# Patient Record
Sex: Male | Born: 1960 | Race: Black or African American | Hispanic: No | Marital: Single | Smoking: Former smoker
Health system: Southern US, Community
[De-identification: ages and names within clinical notes are randomized; demographics above are authoritative.]

## PROBLEM LIST (undated history)

## (undated) DIAGNOSIS — E119 Type 2 diabetes mellitus without complications: Secondary | ICD-10-CM

## (undated) DIAGNOSIS — K859 Acute pancreatitis without necrosis or infection, unspecified: Secondary | ICD-10-CM

## (undated) DIAGNOSIS — I10 Essential (primary) hypertension: Secondary | ICD-10-CM

## (undated) DIAGNOSIS — W3400XA Accidental discharge from unspecified firearms or gun, initial encounter: Secondary | ICD-10-CM

## (undated) DIAGNOSIS — S21339A Puncture wound without foreign body of unspecified front wall of thorax with penetration into thoracic cavity, initial encounter: Secondary | ICD-10-CM

## (undated) DIAGNOSIS — J45909 Unspecified asthma, uncomplicated: Secondary | ICD-10-CM

## (undated) HISTORY — PX: COLOSTOMY: SHX63

## (undated) HISTORY — PX: ABDOMINAL SURGERY: SHX537

---

## 2006-05-13 ENCOUNTER — Other Ambulatory Visit: Payer: Self-pay

## 2006-05-13 ENCOUNTER — Emergency Department: Payer: Self-pay | Admitting: Emergency Medicine

## 2006-05-17 ENCOUNTER — Emergency Department: Payer: Self-pay

## 2007-04-01 ENCOUNTER — Emergency Department: Payer: Self-pay | Admitting: Emergency Medicine

## 2007-04-04 ENCOUNTER — Emergency Department: Payer: Self-pay | Admitting: Unknown Physician Specialty

## 2007-04-04 ENCOUNTER — Other Ambulatory Visit: Payer: Self-pay

## 2008-11-22 ENCOUNTER — Emergency Department: Payer: Self-pay

## 2009-01-22 ENCOUNTER — Emergency Department: Payer: Self-pay | Admitting: Emergency Medicine

## 2012-05-07 ENCOUNTER — Emergency Department: Payer: Self-pay | Admitting: Emergency Medicine

## 2012-05-07 LAB — COMPREHENSIVE METABOLIC PANEL
Albumin: 4.3 g/dL (ref 3.4–5.0)
Alkaline Phosphatase: 96 U/L (ref 50–136)
Bilirubin,Total: 0.7 mg/dL (ref 0.2–1.0)
Co2: 29 mmol/L (ref 21–32)
Creatinine: 1.24 mg/dL (ref 0.60–1.30)
EGFR (Non-African Amer.): >60
Glucose: 100 mg/dL — ABNORMAL HIGH (ref 65–99)
Osmolality: 281 (ref 275–301)
Sodium: 141 mmol/L (ref 136–145)

## 2012-05-07 LAB — DIFFERENTIAL
Basophil #: 0 10*3/uL (ref 0.0–0.1)
Lymphocyte #: 1.7 10*3/uL (ref 1.0–3.6)
Monocyte #: 0.6 x10 3/mm (ref 0.2–1.0)
Monocyte %: 12.8 %
Neutrophil #: 2 10*3/uL (ref 1.4–6.5)
Neutrophil %: 45.3 %

## 2012-05-07 LAB — URINALYSIS, COMPLETE
Bacteria: NONE SEEN
Bilirubin,UR: NEGATIVE
Glucose,UR: NEGATIVE mg/dL (ref 0–75)
Leukocyte Esterase: NEGATIVE
RBC,UR: 3 /HPF (ref 0–5)
Squamous Epithelial: NONE SEEN
WBC UR: NONE SEEN /HPF (ref 0–5)

## 2012-05-07 LAB — CBC
HCT: 45.3 % (ref 40.0–52.0)
MCHC: 32.5 g/dL (ref 32.0–36.0)
MCV: 94 fL (ref 80–100)
Platelet: 168 10*3/uL (ref 150–440)
RDW: 13.4 % (ref 11.5–14.5)
WBC: 4.4 10*3/uL (ref 3.8–10.6)

## 2012-05-07 LAB — LIPASE, BLOOD: Lipase: 112 U/L (ref 73–393)

## 2012-05-10 ENCOUNTER — Emergency Department: Payer: Self-pay | Admitting: Emergency Medicine

## 2012-05-10 LAB — COMPREHENSIVE METABOLIC PANEL
Albumin: 4.4 g/dL (ref 3.4–5.0)
Alkaline Phosphatase: 101 U/L (ref 50–136)
BUN: 13 mg/dL (ref 7–18)
Bilirubin,Total: 0.6 mg/dL (ref 0.2–1.0)
Co2: 30 mmol/L (ref 21–32)
Creatinine: 1.29 mg/dL (ref 0.60–1.30)
EGFR (Non-African Amer.): >60
Osmolality: 274 (ref 275–301)
SGPT (ALT): 102 U/L — ABNORMAL HIGH
Sodium: 137 mmol/L (ref 136–145)
Total Protein: 8.8 g/dL — ABNORMAL HIGH (ref 6.4–8.2)

## 2012-05-10 LAB — CBC
HCT: 47.3 % (ref 40.0–52.0)
HGB: 14.9 g/dL (ref 13.0–18.0)
MCHC: 31.4 g/dL — ABNORMAL LOW (ref 32.0–36.0)
Platelet: 171 10*3/uL (ref 150–440)
RBC: 5.06 10*6/uL (ref 4.40–5.90)

## 2012-05-10 LAB — PROTIME-INR: Prothrombin Time: 12.5 secs (ref 11.5–14.7)

## 2012-05-10 LAB — LIPASE, BLOOD: Lipase: 90 U/L (ref 73–393)

## 2012-12-25 ENCOUNTER — Emergency Department: Payer: Self-pay | Admitting: Emergency Medicine

## 2012-12-25 LAB — URINALYSIS, COMPLETE
Bilirubin,UR: NEGATIVE
Ketone: NEGATIVE
Leukocyte Esterase: NEGATIVE
Nitrite: NEGATIVE
Ph: 7 (ref 4.5–8.0)
Protein: NEGATIVE
Specific Gravity: 1.02 (ref 1.003–1.030)
WBC UR: 1 /HPF (ref 0–5)

## 2012-12-25 LAB — COMPREHENSIVE METABOLIC PANEL
BUN: 8 mg/dL (ref 7–18)
Bilirubin,Total: 0.6 mg/dL (ref 0.2–1.0)
Calcium, Total: 9.5 mg/dL (ref 8.5–10.1)
Chloride: 103 mmol/L (ref 98–107)
Co2: 25 mmol/L (ref 21–32)
Osmolality: 269 (ref 275–301)
Potassium: 4.1 mmol/L (ref 3.5–5.1)
SGPT (ALT): 117 U/L — ABNORMAL HIGH (ref 12–78)
Sodium: 135 mmol/L — ABNORMAL LOW (ref 136–145)

## 2012-12-25 LAB — CBC
HCT: 44.1 % (ref 40.0–52.0)
MCHC: 33.1 g/dL (ref 32.0–36.0)
Platelet: 181 10*3/uL (ref 150–440)
RBC: 4.78 10*6/uL (ref 4.40–5.90)
RDW: 13.9 % (ref 11.5–14.5)
WBC: 5 10*3/uL (ref 3.8–10.6)

## 2012-12-26 ENCOUNTER — Emergency Department: Payer: Self-pay | Admitting: Emergency Medicine

## 2012-12-26 LAB — COMPREHENSIVE METABOLIC PANEL
Albumin: 3.9 g/dL (ref 3.4–5.0)
Alkaline Phosphatase: 87 U/L (ref 50–136)
Bilirubin,Total: 0.6 mg/dL (ref 0.2–1.0)
Co2: 25 mmol/L (ref 21–32)
Creatinine: 1.06 mg/dL (ref 0.60–1.30)
EGFR (African American): >60
EGFR (Non-African Amer.): >60
Glucose: 99 mg/dL (ref 65–99)
Osmolality: 268 (ref 275–301)
Potassium: 4.2 mmol/L (ref 3.5–5.1)
Sodium: 134 mmol/L — ABNORMAL LOW (ref 136–145)

## 2012-12-26 LAB — CBC
HCT: 42.7 % (ref 40.0–52.0)
HGB: 14.1 g/dL (ref 13.0–18.0)
MCH: 30.8 pg (ref 26.0–34.0)
MCHC: 33 g/dL (ref 32.0–36.0)
MCV: 93 fL (ref 80–100)
Platelet: 163 x10 3/mm 3 (ref 150–440)
RBC: 4.59 x10 6/mm 3 (ref 4.40–5.90)
RDW: 14 % (ref 11.5–14.5)
WBC: 7.2 x10 3/mm 3 (ref 3.8–10.6)

## 2012-12-26 LAB — LIPASE, BLOOD: Lipase: 85 U/L (ref 73–393)

## 2013-04-18 ENCOUNTER — Emergency Department: Payer: Self-pay | Admitting: Emergency Medicine

## 2013-04-20 ENCOUNTER — Emergency Department: Payer: Self-pay | Admitting: Emergency Medicine

## 2013-04-22 LAB — WOUND CULTURE

## 2013-11-22 ENCOUNTER — Emergency Department: Payer: Self-pay | Admitting: Emergency Medicine

## 2013-11-22 LAB — BASIC METABOLIC PANEL
ANION GAP: 6 — AB (ref 7–16)
BUN: 12 mg/dL (ref 7–18)
CREATININE: 1.04 mg/dL (ref 0.60–1.30)
Calcium, Total: 9.8 mg/dL (ref 8.5–10.1)
Chloride: 103 mmol/L (ref 98–107)
Co2: 26 mmol/L (ref 21–32)
EGFR (African American): >60
Glucose: 96 mg/dL (ref 65–99)
Osmolality: 270 (ref 275–301)
Potassium: 3.8 mmol/L (ref 3.5–5.1)
SODIUM: 135 mmol/L — AB (ref 136–145)

## 2013-11-22 LAB — CBC
HCT: 43.8 % (ref 40.0–52.0)
HGB: 14.5 g/dL (ref 13.0–18.0)
MCH: 30.8 pg (ref 26.0–34.0)
MCHC: 33.1 g/dL (ref 32.0–36.0)
MCV: 93 fL (ref 80–100)
Platelet: 156 10*3/uL (ref 150–440)
RBC: 4.7 10*6/uL (ref 4.40–5.90)
RDW: 13.6 % (ref 11.5–14.5)
WBC: 3.9 10*3/uL (ref 3.8–10.6)

## 2013-11-22 LAB — LIPASE, BLOOD: Lipase: 168 U/L (ref 73–393)

## 2013-11-22 LAB — TROPONIN I

## 2013-11-24 ENCOUNTER — Emergency Department: Payer: Self-pay | Admitting: Emergency Medicine

## 2013-11-24 LAB — COMPREHENSIVE METABOLIC PANEL
ANION GAP: 5 — AB (ref 7–16)
AST: 45 U/L — AB (ref 15–37)
Albumin: 4.3 g/dL (ref 3.4–5.0)
Alkaline Phosphatase: 88 U/L
BILIRUBIN TOTAL: 0.7 mg/dL (ref 0.2–1.0)
BUN: 11 mg/dL (ref 7–18)
CALCIUM: 9.6 mg/dL (ref 8.5–10.1)
CREATININE: 0.92 mg/dL (ref 0.60–1.30)
Chloride: 105 mmol/L (ref 98–107)
Co2: 25 mmol/L (ref 21–32)
EGFR (Non-African Amer.): >60
GLUCOSE: 81 mg/dL (ref 65–99)
Osmolality: 269 (ref 275–301)
POTASSIUM: 3.9 mmol/L (ref 3.5–5.1)
SGPT (ALT): 69 U/L (ref 12–78)
Sodium: 135 mmol/L — ABNORMAL LOW (ref 136–145)
Total Protein: 8.5 g/dL — ABNORMAL HIGH (ref 6.4–8.2)

## 2013-11-24 LAB — CBC WITH DIFFERENTIAL/PLATELET
Basophil #: 0 10*3/uL (ref 0.0–0.1)
Basophil %: 0.7 %
EOS ABS: 0.1 10*3/uL (ref 0.0–0.7)
Eosinophil %: 2.6 %
HCT: 42.6 % (ref 40.0–52.0)
HGB: 14.3 g/dL (ref 13.0–18.0)
LYMPHS PCT: 50.1 %
Lymphocyte #: 2.5 10*3/uL (ref 1.0–3.6)
MCH: 31.1 pg (ref 26.0–34.0)
MCHC: 33.4 g/dL (ref 32.0–36.0)
MCV: 93 fL (ref 80–100)
Monocyte #: 0.6 x10 3/mm (ref 0.2–1.0)
Monocyte %: 11.3 %
Neutrophil #: 1.8 10*3/uL (ref 1.4–6.5)
Neutrophil %: 35.3 %
PLATELETS: 159 10*3/uL (ref 150–440)
RBC: 4.58 10*6/uL (ref 4.40–5.90)
RDW: 13 % (ref 11.5–14.5)
WBC: 5 10*3/uL (ref 3.8–10.6)

## 2013-11-24 LAB — URINALYSIS, COMPLETE
BILIRUBIN, UR: NEGATIVE
Bacteria: NEGATIVE
Blood: NEGATIVE
Glucose,UR: NEGATIVE mg/dL (ref 0–75)
Ketone: NEGATIVE
LEUKOCYTE ESTERASE: NEGATIVE
NITRITE: NEGATIVE
PROTEIN: NEGATIVE
Ph: 6 (ref 4.5–8.0)
RBC, UR: NONE SEEN /HPF (ref 0–5)
SQUAMOUS EPITHELIAL: NONE SEEN
Specific Gravity: 1.001 (ref 1.003–1.030)
WBC UR: NONE SEEN /HPF (ref 0–5)

## 2013-11-24 LAB — LIPASE, BLOOD: LIPASE: 120 U/L (ref 73–393)

## 2013-11-24 LAB — TROPONIN I: Troponin-I: <0.02 ng/mL

## 2014-04-08 ENCOUNTER — Emergency Department: Payer: Self-pay | Admitting: Emergency Medicine

## 2014-04-08 LAB — COMPREHENSIVE METABOLIC PANEL
ALBUMIN: 4.2 g/dL (ref 3.4–5.0)
ALK PHOS: 90 U/L
ALT: 56 U/L (ref 12–78)
ANION GAP: 6 — AB (ref 7–16)
AST: 51 U/L — AB (ref 15–37)
BUN: 9 mg/dL (ref 7–18)
Bilirubin,Total: 0.6 mg/dL (ref 0.2–1.0)
CO2: 27 mmol/L (ref 21–32)
Calcium, Total: 9.7 mg/dL (ref 8.5–10.1)
Chloride: 103 mmol/L (ref 98–107)
Creatinine: 1.02 mg/dL (ref 0.60–1.30)
EGFR (African American): >60
Glucose: 97 mg/dL (ref 65–99)
Osmolality: 271 (ref 275–301)
POTASSIUM: 3.9 mmol/L (ref 3.5–5.1)
SODIUM: 136 mmol/L (ref 136–145)
TOTAL PROTEIN: 8.6 g/dL — AB (ref 6.4–8.2)

## 2014-04-08 LAB — URINALYSIS, COMPLETE
Bacteria: NONE SEEN
Bilirubin,UR: NEGATIVE
Blood: NEGATIVE
GLUCOSE, UR: NEGATIVE mg/dL (ref 0–75)
Ketone: NEGATIVE
Leukocyte Esterase: NEGATIVE
NITRITE: NEGATIVE
PROTEIN: NEGATIVE
Ph: 8 (ref 4.5–8.0)
RBC,UR: NONE SEEN /HPF (ref 0–5)
Specific Gravity: 1.014 (ref 1.003–1.030)
Squamous Epithelial: NONE SEEN
WBC UR: NONE SEEN /HPF (ref 0–5)

## 2014-04-08 LAB — CBC WITH DIFFERENTIAL/PLATELET
BASOS PCT: 0.4 %
Basophil #: 0 10*3/uL (ref 0.0–0.1)
EOS PCT: 0.9 %
Eosinophil #: 0.1 10*3/uL (ref 0.0–0.7)
HCT: 45.5 % (ref 40.0–52.0)
HGB: 14.6 g/dL (ref 13.0–18.0)
LYMPHS PCT: 34.5 %
Lymphocyte #: 2.2 10*3/uL (ref 1.0–3.6)
MCH: 30.3 pg (ref 26.0–34.0)
MCHC: 32.1 g/dL (ref 32.0–36.0)
MCV: 95 fL (ref 80–100)
Monocyte #: 0.7 x10 3/mm (ref 0.2–1.0)
Monocyte %: 10.8 %
Neutrophil #: 3.3 10*3/uL (ref 1.4–6.5)
Neutrophil %: 53.4 %
PLATELETS: 196 10*3/uL (ref 150–440)
RBC: 4.81 10*6/uL (ref 4.40–5.90)
RDW: 13.3 % (ref 11.5–14.5)
WBC: 6.3 10*3/uL (ref 3.8–10.6)

## 2014-04-08 LAB — TROPONIN I

## 2014-04-08 LAB — LIPASE, BLOOD: Lipase: 121 U/L (ref 73–393)

## 2014-04-29 ENCOUNTER — Emergency Department: Payer: Self-pay | Admitting: Emergency Medicine

## 2015-10-11 ENCOUNTER — Emergency Department
Admission: EM | Admit: 2015-10-11 | Discharge: 2015-10-11 | Disposition: A | Discharge status: Home / Self Care | Payer: Self-pay | Attending: Emergency Medicine | Admitting: Emergency Medicine

## 2015-10-11 ENCOUNTER — Emergency Department: Payer: Self-pay

## 2015-10-11 DIAGNOSIS — R140 Abdominal distension (gaseous): Secondary | ICD-10-CM | POA: Insufficient documentation

## 2015-10-11 DIAGNOSIS — I10 Essential (primary) hypertension: Secondary | ICD-10-CM | POA: Insufficient documentation

## 2015-10-11 DIAGNOSIS — R1013 Epigastric pain: Secondary | ICD-10-CM | POA: Insufficient documentation

## 2015-10-11 DIAGNOSIS — R110 Nausea: Secondary | ICD-10-CM | POA: Insufficient documentation

## 2015-10-11 HISTORY — DX: Accidental discharge from unspecified firearms or gun, initial encounter: W34.00XA

## 2015-10-11 HISTORY — DX: Essential (primary) hypertension: I10

## 2015-10-11 HISTORY — DX: Puncture wound without foreign body of unspecified front wall of thorax with penetration into thoracic cavity, initial encounter: S21.339A

## 2015-10-11 HISTORY — DX: Acute pancreatitis without necrosis or infection, unspecified: K85.90

## 2015-10-11 LAB — URINALYSIS COMPLETE WITH MICROSCOPIC (ARMC ONLY)
BACTERIA UA: NONE SEEN
BILIRUBIN URINE: NEGATIVE
Glucose, UA: NEGATIVE mg/dL
HGB URINE DIPSTICK: NEGATIVE
Ketones, ur: NEGATIVE mg/dL
LEUKOCYTES UA: NEGATIVE
Nitrite: NEGATIVE
PH: 6 (ref 5.0–8.0)
PROTEIN: 30 mg/dL — AB
SQUAMOUS EPITHELIAL / LPF: NONE SEEN
Specific Gravity, Urine: 1.028 (ref 1.005–1.030)

## 2015-10-11 LAB — COMPREHENSIVE METABOLIC PANEL
ALT: 36 U/L (ref 17–63)
ANION GAP: 6 (ref 5–15)
AST: 27 U/L (ref 15–41)
Albumin: 5 g/dL (ref 3.5–5.0)
Alkaline Phosphatase: 90 U/L (ref 38–126)
BUN: 14 mg/dL (ref 6–20)
CHLORIDE: 102 mmol/L (ref 101–111)
CO2: 27 mmol/L (ref 22–32)
CREATININE: 1 mg/dL (ref 0.61–1.24)
Calcium: 10.1 mg/dL (ref 8.9–10.3)
Glucose, Bld: 115 mg/dL — ABNORMAL HIGH (ref 65–99)
POTASSIUM: 4.2 mmol/L (ref 3.5–5.1)
SODIUM: 135 mmol/L (ref 135–145)
Total Bilirubin: 0.7 mg/dL (ref 0.3–1.2)
Total Protein: 8.7 g/dL — ABNORMAL HIGH (ref 6.5–8.1)

## 2015-10-11 LAB — CBC
HEMATOCRIT: 45.9 % (ref 40.0–52.0)
HEMOGLOBIN: 15 g/dL (ref 13.0–18.0)
MCH: 28.9 pg (ref 26.0–34.0)
MCHC: 32.6 g/dL (ref 32.0–36.0)
MCV: 88.5 fL (ref 80.0–100.0)
PLATELETS: 197 10*3/uL (ref 150–440)
RBC: 5.18 MIL/uL (ref 4.40–5.90)
RDW: 13.9 % (ref 11.5–14.5)
WBC: 7.2 10*3/uL (ref 3.8–10.6)

## 2015-10-11 LAB — LIPASE, BLOOD: LIPASE: 25 U/L (ref 11–51)

## 2015-10-11 MED ORDER — MORPHINE SULFATE (PF) 4 MG/ML IV SOLN
4.0000 mg | Freq: Once | INTRAVENOUS | Status: AC
  Administered 2015-10-11: 4 mg via INTRAVENOUS

## 2015-10-11 MED ORDER — IOHEXOL 240 MG/ML SOLN
25.0000 mL | Freq: Once | INTRAMUSCULAR | Status: AC | PRN
  Administered 2015-10-11: 25 mL via ORAL

## 2015-10-11 MED ORDER — ONDANSETRON HCL 4 MG/2ML IJ SOLN
4.0000 mg | Freq: Once | INTRAMUSCULAR | Status: AC
  Administered 2015-10-11: 4 mg via INTRAVENOUS
  Filled 2015-10-11: qty 2

## 2015-10-11 MED ORDER — OXYCODONE HCL 5 MG PO TABS
10.0000 mg | ORAL_TABLET | Freq: Once | ORAL | Status: AC
  Administered 2015-10-11: 10 mg via ORAL
  Filled 2015-10-11: qty 2

## 2015-10-11 MED ORDER — PANTOPRAZOLE SODIUM 40 MG PO TBEC
40.0000 mg | DELAYED_RELEASE_TABLET | Freq: Every day | ORAL | Status: DC
  Administered 2015-10-11: 40 mg via ORAL
  Filled 2015-10-11: qty 1

## 2015-10-11 MED ORDER — HYDROMORPHONE HCL 1 MG/ML IJ SOLN
1.0000 mg | Freq: Once | INTRAMUSCULAR | Status: AC
  Administered 2015-10-11: 1 mg via INTRAVENOUS
  Filled 2015-10-11: qty 1

## 2015-10-11 MED ORDER — MORPHINE SULFATE (PF) 4 MG/ML IV SOLN
INTRAVENOUS | Status: AC
  Filled 2015-10-11: qty 1

## 2015-10-11 MED ORDER — PROMETHAZINE HCL 12.5 MG PO TABS: 12.5000 mg | ORAL_TABLET | Freq: Four times a day (QID) | ORAL | Status: DC | PRN

## 2015-10-11 MED ORDER — IOHEXOL 300 MG/ML  SOLN
100.0000 mL | Freq: Once | INTRAMUSCULAR | Status: AC | PRN
  Administered 2015-10-11: 100 mL via INTRAVENOUS

## 2015-10-11 MED ORDER — RANITIDINE HCL 150 MG PO TABS: 150.0000 mg | ORAL_TABLET | Freq: Two times a day (BID) | ORAL | Status: DC

## 2015-10-11 MED ORDER — ONDANSETRON HCL 4 MG/2ML IJ SOLN
4.0000 mg | Freq: Once | INTRAMUSCULAR | Status: AC | PRN
  Administered 2015-10-11: 4 mg via INTRAVENOUS
  Filled 2015-10-11: qty 2

## 2015-10-11 MED ORDER — ALUM & MAG HYDROXIDE-SIMETH 200-200-20 MG/5ML PO SUSP
30.0000 mL | Freq: Once | ORAL | Status: AC
  Administered 2015-10-11: 30 mL via ORAL
  Filled 2015-10-11: qty 30

## 2015-10-11 MED ORDER — GI COCKTAIL ~~LOC~~
30.0000 mL | Freq: Once | ORAL | Status: AC
  Administered 2015-10-11: 30 mL via ORAL
  Filled 2015-10-11: qty 30

## 2015-10-11 MED ORDER — OXYCODONE HCL 5 MG PO TABS: 5.0000 mg | ORAL_TABLET | ORAL | Status: DC | PRN

## 2015-10-11 MED ORDER — FAMOTIDINE 20 MG PO TABS
40.0000 mg | ORAL_TABLET | Freq: Once | ORAL | Status: AC
  Administered 2015-10-11: 40 mg via ORAL
  Filled 2015-10-11: qty 2

## 2015-10-11 MED ORDER — SODIUM CHLORIDE 0.9 % IV BOLUS (SEPSIS)
1000.0000 mL | Freq: Once | INTRAVENOUS | Status: AC
  Administered 2015-10-11: 1000 mL via INTRAVENOUS

## 2015-10-11 NOTE — Discharge Instructions (Signed)
Your blood tests, CT scan, an ultrasound, Appeared normal. We discussed the possibility that you have a gastric or difficulty Tunnel ulcer. He felt better after Maalox, but the pain came back fairly soon. He may continue to take Maalox as an outpatient. He may use oxycodone to help control the pain. It is important to reduce stomach acid. Take ranitidine twice a day. He may use Phenergan if needed for nausea. Follow-up with [facility], but called Dr. Nigel Bridgemanh's office also for direct contact. He will likely need endoscopy. Return to the emergency department if you have uncontrolled pain, if he began to have dark stools, feel weak, or have other urgent concerns.  Abdominal Pain, Adult Many things can cause belly (abdominal) pain. Most times, the belly pain is not dangerous. Many cases of belly pain can be watched and treated at home. HOME CARE   Do not take medicines that help you go poop (laxatives) unless told to by your doctor.  Only take medicine as told by your doctor.  Eat or drink as told by your doctor. Your doctor will tell you if you should be on a special diet. GET HELP IF:  You do not know what is causing your belly pain.  You have belly pain while you are sick to your stomach (nauseous) or have runny poop (diarrhea).  You have pain while you pee or poop.  Your belly pain wakes you up at night.  You have belly pain that gets worse or better when you eat.  You have belly pain that gets worse when you eat fatty foods.  You have a fever. GET HELP RIGHT AWAY IF:   The pain does not go away within 2 hours.  You keep throwing up (vomiting).  The pain changes and is only in the right or left part of the belly.  You have bloody or tarry looking poop. MAKE SURE YOU:   Understand these instructions.  Will watch your condition.  Will get help right away if you are not doing well or get worse.   This information is not intended to replace advice given to you by  your health care provider. Make sure you discuss any questions you have with your health care provider.   Document Released: 03/23/2008 Document Revised: 10/26/2014 Document Reviewed: 06/14/2013 Elsevier Interactive Patient Education [employer]2016 Elsevier Inc.

## 2015-10-11 NOTE — ED Notes (Signed)
Pt transported to ultrasound.
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
## 2015-10-11 NOTE — ED Notes (Signed)
Pt c/o central abdominal pain since Wednesday. Pt sts he has felt nauseous but no vomiting or diarrhea. Pt has a history of pancreatitis and sts this pain feels similar.

## 2016-02-04 ENCOUNTER — Emergency Department
Admission: EM | Admit: 2016-02-04 | Discharge: 2016-02-04 | Disposition: A | Discharge status: Home / Self Care | Payer: Self-pay | Attending: Emergency Medicine | Admitting: Emergency Medicine

## 2016-02-04 ENCOUNTER — Emergency Department: Payer: Self-pay

## 2016-02-04 ENCOUNTER — Encounter: Payer: Self-pay | Admitting: Emergency Medicine

## 2016-02-04 DIAGNOSIS — R1013 Epigastric pain: Secondary | ICD-10-CM

## 2016-02-04 DIAGNOSIS — I10 Essential (primary) hypertension: Secondary | ICD-10-CM | POA: Insufficient documentation

## 2016-02-04 DIAGNOSIS — R1010 Upper abdominal pain, unspecified: Secondary | ICD-10-CM | POA: Insufficient documentation

## 2016-02-04 DIAGNOSIS — R112 Nausea with vomiting, unspecified: Secondary | ICD-10-CM | POA: Insufficient documentation

## 2016-02-04 LAB — COMPREHENSIVE METABOLIC PANEL
ALT: 43 U/L (ref 17–63)
ANION GAP: 4 — AB (ref 5–15)
AST: 51 U/L — ABNORMAL HIGH (ref 15–41)
Albumin: 4.3 g/dL (ref 3.5–5.0)
Alkaline Phosphatase: 83 U/L (ref 38–126)
BILIRUBIN TOTAL: 0.6 mg/dL (ref 0.3–1.2)
BUN: 15 mg/dL (ref 6–20)
CHLORIDE: 105 mmol/L (ref 101–111)
CO2: 27 mmol/L (ref 22–32)
Calcium: 9.7 mg/dL (ref 8.9–10.3)
Creatinine, Ser: 1.09 mg/dL (ref 0.61–1.24)
Glucose, Bld: 164 mg/dL — ABNORMAL HIGH (ref 65–99)
POTASSIUM: 3.3 mmol/L — AB (ref 3.5–5.1)
Sodium: 136 mmol/L (ref 135–145)
TOTAL PROTEIN: 7.5 g/dL (ref 6.5–8.1)

## 2016-02-04 LAB — CBC
HEMATOCRIT: 39.7 % — AB (ref 40.0–52.0)
HEMOGLOBIN: 13.3 g/dL (ref 13.0–18.0)
MCH: 29.7 pg (ref 26.0–34.0)
MCHC: 33.4 g/dL (ref 32.0–36.0)
MCV: 88.9 fL (ref 80.0–100.0)
Platelets: 174 10*3/uL (ref 150–440)
RBC: 4.46 MIL/uL (ref 4.40–5.90)
RDW: 13.7 % (ref 11.5–14.5)
WBC: 6.3 10*3/uL (ref 3.8–10.6)

## 2016-02-04 LAB — URINALYSIS COMPLETE WITH MICROSCOPIC (ARMC ONLY)
BACTERIA UA: NONE SEEN
Bilirubin Urine: NEGATIVE
GLUCOSE, UA: NEGATIVE mg/dL
HGB URINE DIPSTICK: NEGATIVE
Ketones, ur: NEGATIVE mg/dL
LEUKOCYTES UA: NEGATIVE
NITRITE: NEGATIVE
PH: 8 (ref 5.0–8.0)
Protein, ur: NEGATIVE mg/dL
SPECIFIC GRAVITY, URINE: 1.02 (ref 1.005–1.030)

## 2016-02-04 LAB — TROPONIN I

## 2016-02-04 LAB — LIPASE, BLOOD: LIPASE: 49 U/L (ref 11–51)

## 2016-02-04 MED ORDER — DICYCLOMINE HCL 20 MG PO TABS: 20.0000 mg | ORAL_TABLET | Freq: Three times a day (TID) | ORAL | Status: DC | PRN

## 2016-02-04 MED ORDER — GI COCKTAIL ~~LOC~~
30.0000 mL | Freq: Once | ORAL | Status: AC
  Administered 2016-02-04: 30 mL via ORAL
  Filled 2016-02-04: qty 30

## 2016-02-04 MED ORDER — DIATRIZOATE MEGLUMINE & SODIUM 66-10 % PO SOLN
15.0000 mL | Freq: Once | ORAL | Status: AC
Start: 2016-02-04 — End: 2016-02-04
  Administered 2016-02-04: 15 mL via ORAL
  Filled 2016-02-04: qty 30

## 2016-02-04 MED ORDER — IOPAMIDOL (ISOVUE-300) INJECTION 61%
100.0000 mL | Freq: Once | INTRAVENOUS | Status: AC | PRN
  Administered 2016-02-04: 100 mL via INTRAVENOUS

## 2016-02-04 MED ORDER — ONDANSETRON HCL 4 MG/2ML IJ SOLN
4.0000 mg | Freq: Once | INTRAMUSCULAR | Status: AC
  Administered 2016-02-04: 4 mg via INTRAVENOUS
  Filled 2016-02-04: qty 2

## 2016-02-04 MED ORDER — OXYCODONE-ACETAMINOPHEN 5-325 MG PO TABS: 1.0000 | ORAL_TABLET | Freq: Four times a day (QID) | ORAL | Status: DC | PRN

## 2016-02-04 MED ORDER — RANITIDINE HCL 150 MG/10ML PO SYRP
150.0000 mg | ORAL_SOLUTION | Freq: Once | ORAL | Status: DC
  Filled 2016-02-04: qty 10

## 2016-02-04 MED ORDER — FAMOTIDINE 20 MG PO TABS
40.0000 mg | ORAL_TABLET | Freq: Once | ORAL | Status: AC
  Administered 2016-02-04: 40 mg via ORAL
  Filled 2016-02-04: qty 2

## 2016-02-04 MED ORDER — MORPHINE SULFATE (PF) 4 MG/ML IV SOLN
4.0000 mg | Freq: Once | INTRAVENOUS | Status: AC
  Administered 2016-02-04: 4 mg via INTRAVENOUS
  Filled 2016-02-04: qty 1

## 2016-02-04 MED ORDER — RANITIDINE HCL 75 MG PO TABS: 75.0000 mg | ORAL_TABLET | Freq: Two times a day (BID) | ORAL | Status: DC

## 2016-02-04 MED ORDER — SODIUM CHLORIDE 0.9 % IV BOLUS (SEPSIS)
1000.0000 mL | Freq: Once | INTRAVENOUS | Status: AC
  Administered 2016-02-04: 1000 mL via INTRAVENOUS

## 2016-02-04 MED ORDER — METOCLOPRAMIDE HCL 10 MG PO TABS: 10.0000 mg | ORAL_TABLET | Freq: Four times a day (QID) | ORAL | Status: DC | PRN

## 2016-02-04 NOTE — ED Notes (Addendum)
Pt presents to ED via EMS with epigastric pain that radiates to his back; started Sunday. Reports having nausea but denies vomiting or diarrhea. Reports similar symptoms previously but states he never got it checked out.

## 2016-02-04 NOTE — Discharge Instructions (Signed)
Abdominal Pain, Adult °Many things can cause abdominal pain. Usually, abdominal pain is not caused by a disease and will improve without treatment. It can often be observed and treated at home. Your health care provider will do a physical exam and possibly order blood tests and X-rays to help determine the seriousness of your pain. However, in many cases, more time must pass before a clear cause of the pain can be found. Before that point, your health care provider may not know if you need more testing or further treatment. °HOME CARE INSTRUCTIONS °Monitor your abdominal pain for any changes. The following actions may help to alleviate any discomfort you are experiencing: °· Only take over-the-counter or prescription medicines as directed by your health care provider. °· Do not take laxatives unless directed to do so by your health care provider. °· Try a clear liquid diet (broth, tea, or water) as directed by your health care provider. Slowly move to a bland diet as tolerated. °SEEK MEDICAL CARE IF: °· You have unexplained abdominal pain. °· You have abdominal pain associated with nausea or diarrhea. °· You have pain when you urinate or have a bowel movement. °· You experience abdominal pain that wakes you in the night. °· You have abdominal pain that is worsened or improved by eating food. °· You have abdominal pain that is worsened with eating fatty foods. °· You have a fever. °SEEK IMMEDIATE MEDICAL CARE IF: °· Your pain does not go away within 2 hours. °· You keep throwing up (vomiting). °· Your pain is felt only in portions of the abdomen, such as the right side or the left lower portion of the abdomen. °· You pass bloody or black tarry stools. °MAKE SURE YOU: °· Understand these instructions. °· Will watch your condition. °· Will get help right away if you are not doing well or get worse. °  °This information is not intended to replace advice given to you by your health care provider. Make sure you discuss  any questions you have with your health care provider. °  °Document Released: 07/15/2005 Document Revised: 06/26/2015 Document Reviewed: 06/14/2013 °Elsevier Interactive Patient Education ©2016 Elsevier Inc. ° °Nausea and Vomiting °Nausea is a sick feeling that often comes before throwing up (vomiting). Vomiting is a reflex where stomach contents come out of your mouth. Vomiting can cause severe loss of body fluids (dehydration). Children and elderly adults can become dehydrated quickly, especially if they also have diarrhea. Nausea and vomiting are symptoms of a condition or disease. It is important to find the cause of your symptoms. °CAUSES  °· Direct irritation of the stomach lining. This irritation can result from increased acid production (gastroesophageal reflux disease), infection, food poisoning, taking certain medicines (such as nonsteroidal anti-inflammatory drugs), alcohol use, or tobacco use. °· Signals from the brain. These signals could be caused by a headache, heat exposure, an inner ear disturbance, increased pressure in the brain from injury, infection, a tumor, or a concussion, pain, emotional stimulus, or metabolic problems. °· An obstruction in the gastrointestinal tract (bowel obstruction). °· Illnesses such as diabetes, hepatitis, gallbladder problems, appendicitis, kidney problems, cancer, sepsis, atypical symptoms of a heart attack, or eating disorders. °· Medical treatments such as chemotherapy and radiation. °· Receiving medicine that makes you sleep (general anesthetic) during surgery. °DIAGNOSIS °Your caregiver may ask for tests to be done if the problems do not improve after a few days. Tests may also be done if symptoms are severe or if the reason for the   nausea and vomiting is not clear. Tests may include: °· Urine tests. °· Blood tests. °· Stool tests. °· Cultures (to look for evidence of infection). °· X-rays or other imaging studies. °Test results can help your caregiver make  decisions about treatment or the need for additional tests. °TREATMENT °You need to stay well hydrated. Drink frequently but in small amounts. You may wish to drink water, sports drinks, clear broth, or eat frozen ice pops or gelatin dessert to help stay hydrated. When you eat, eating slowly may help prevent nausea. There are also some antinausea medicines that may help prevent nausea. °HOME CARE INSTRUCTIONS  °· Take all medicine as directed by your caregiver. °· If you do not have an appetite, do not force yourself to eat. However, you must continue to drink fluids. °· If you have an appetite, eat a normal diet unless your caregiver tells you differently. °¨ Eat a variety of complex carbohydrates (rice, wheat, potatoes, bread), lean meats, yogurt, fruits, and vegetables. °¨ Avoid high-fat foods because they are more difficult to digest. °· Drink enough water and fluids to keep your urine clear or pale yellow. °· If you are dehydrated, ask your caregiver for specific rehydration instructions. Signs of dehydration may include: °¨ Severe thirst. °¨ Dry lips and mouth. °¨ Dizziness. °¨ Dark urine. °¨ Decreasing urine frequency and amount. °¨ Confusion. °¨ Rapid breathing or pulse. °SEEK IMMEDIATE MEDICAL CARE IF:  °· You have blood or brown flecks (like coffee grounds) in your vomit. °· You have black or bloody stools. °· You have a severe headache or stiff neck. °· You are confused. °· You have severe abdominal pain. °· You have chest pain or trouble breathing. °· You do not urinate at least once every 8 hours. °· You develop cold or clammy skin. °· You continue to vomit for longer than 24 to 48 hours. °· You have a fever. °MAKE SURE YOU:  °· Understand these instructions. °· Will watch your condition. °· Will get help right away if you are not doing well or get worse. °  °This information is not intended to replace advice given to you by your health care provider. Make sure you discuss any questions you have with  your health care provider. °  °Document Released: 10/05/2005 Document Revised: 12/28/2011 Document Reviewed: 03/04/2011 °Elsevier Interactive Patient Education ©2016 Else[employer] Inc. ° °

## 2016-02-04 NOTE — ED Notes (Signed)
Pt requesting to speak with MD prior to d/c
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
## 2016-12-19 ENCOUNTER — Encounter: Payer: Self-pay | Admitting: Emergency Medicine

## 2016-12-19 ENCOUNTER — Emergency Department: Payer: Self-pay

## 2016-12-19 ENCOUNTER — Emergency Department
Admission: EM | Admit: 2016-12-19 | Discharge: 2016-12-19 | Disposition: A | Discharge status: Home / Self Care | Payer: Self-pay | Attending: Emergency Medicine | Admitting: Emergency Medicine

## 2016-12-19 DIAGNOSIS — R1030 Lower abdominal pain, unspecified: Secondary | ICD-10-CM | POA: Insufficient documentation

## 2016-12-19 DIAGNOSIS — R109 Unspecified abdominal pain: Secondary | ICD-10-CM

## 2016-12-19 DIAGNOSIS — I10 Essential (primary) hypertension: Secondary | ICD-10-CM | POA: Insufficient documentation

## 2016-12-19 DIAGNOSIS — Z79899 Other long term (current) drug therapy: Secondary | ICD-10-CM | POA: Insufficient documentation

## 2016-12-19 DIAGNOSIS — R110 Nausea: Secondary | ICD-10-CM | POA: Insufficient documentation

## 2016-12-19 DIAGNOSIS — R0602 Shortness of breath: Secondary | ICD-10-CM | POA: Insufficient documentation

## 2016-12-19 LAB — BASIC METABOLIC PANEL
ANION GAP: 9 (ref 5–15)
BUN: 16 mg/dL (ref 6–20)
CHLORIDE: 100 mmol/L — AB (ref 101–111)
CO2: 26 mmol/L (ref 22–32)
Calcium: 10.5 mg/dL — ABNORMAL HIGH (ref 8.9–10.3)
Creatinine, Ser: 1.13 mg/dL (ref 0.61–1.24)
GFR calc non Af Amer: >60 mL/min (ref >60)
Glucose, Bld: 118 mg/dL — ABNORMAL HIGH (ref 65–99)
POTASSIUM: 4.2 mmol/L (ref 3.5–5.1)
SODIUM: 135 mmol/L (ref 135–145)

## 2016-12-19 LAB — CBC
HEMATOCRIT: 44.4 % (ref 40.0–52.0)
Hemoglobin: 14.4 g/dL (ref 13.0–18.0)
MCH: 28.6 pg (ref 26.0–34.0)
MCHC: 32.4 g/dL (ref 32.0–36.0)
MCV: 88.3 fL (ref 80.0–100.0)
Platelets: 212 10*3/uL (ref 150–440)
RBC: 5.02 MIL/uL (ref 4.40–5.90)
RDW: 14 % (ref 11.5–14.5)
WBC: 7.5 10*3/uL (ref 3.8–10.6)

## 2016-12-19 LAB — HEPATIC FUNCTION PANEL
ALBUMIN: 4.5 g/dL (ref 3.5–5.0)
ALK PHOS: 100 U/L (ref 38–126)
ALT: 37 U/L (ref 17–63)
AST: 28 U/L (ref 15–41)
BILIRUBIN TOTAL: 0.8 mg/dL (ref 0.3–1.2)
Bilirubin, Direct: 0.1 mg/dL (ref 0.1–0.5)
Indirect Bilirubin: 0.7 mg/dL (ref 0.3–0.9)
Total Protein: 8.3 g/dL — ABNORMAL HIGH (ref 6.5–8.1)

## 2016-12-19 LAB — BRAIN NATRIURETIC PEPTIDE: B Natriuretic Peptide: 12 pg/mL (ref 0.0–100.0)

## 2016-12-19 LAB — TROPONIN I: Troponin I: <0.03 ng/mL (ref <0.03)

## 2016-12-19 LAB — LIPASE, BLOOD: Lipase: 15 U/L (ref 11–51)

## 2016-12-19 MED ORDER — GI COCKTAIL ~~LOC~~
30.0000 mL | Freq: Once | ORAL | Status: AC
  Administered 2016-12-19: 30 mL via ORAL
  Filled 2016-12-19: qty 30

## 2016-12-19 MED ORDER — DICYCLOMINE HCL 10 MG PO CAPS
20.0000 mg | ORAL_CAPSULE | Freq: Once | ORAL | Status: AC
  Administered 2016-12-19: 20 mg via ORAL
  Filled 2016-12-19: qty 2

## 2016-12-19 MED ORDER — MORPHINE SULFATE (PF) 4 MG/ML IV SOLN
4.0000 mg | Freq: Once | INTRAVENOUS | Status: AC
  Administered 2016-12-19: 4 mg via INTRAVENOUS

## 2016-12-19 MED ORDER — MORPHINE SULFATE (PF) 4 MG/ML IV SOLN
INTRAVENOUS | Status: AC
  Administered 2016-12-19: 4 mg via INTRAVENOUS
  Filled 2016-12-19: qty 1

## 2016-12-19 MED ORDER — IOPAMIDOL (ISOVUE-300) INJECTION 61%
100.0000 mL | Freq: Once | INTRAVENOUS | Status: AC | PRN
  Administered 2016-12-19: 100 mL via INTRAVENOUS

## 2016-12-19 MED ORDER — IOPAMIDOL (ISOVUE-300) INJECTION 61%
30.0000 mL | Freq: Once | INTRAVENOUS | Status: AC
  Administered 2016-12-19: 30 mL via ORAL

## 2016-12-19 MED ORDER — IPRATROPIUM-ALBUTEROL 0.5-2.5 (3) MG/3ML IN SOLN
3.0000 mL | Freq: Once | RESPIRATORY_TRACT | Status: AC
  Administered 2016-12-19: 3 mL via RESPIRATORY_TRACT
  Filled 2016-12-19: qty 3

## 2016-12-19 MED ORDER — FAMOTIDINE IN NACL 20-0.9 MG/50ML-% IV SOLN
20.0000 mg | Freq: Once | INTRAVENOUS | Status: AC
  Administered 2016-12-19: 20 mg via INTRAVENOUS
  Filled 2016-12-19: qty 50
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
## 2016-12-19 NOTE — ED Notes (Signed)
Pt sleeping with normal respirations.

## 2016-12-19 NOTE — ED Triage Notes (Signed)
Pt reports epigastric abdominal pain and shortness of breath for the past several days. Pt states he was seen by PCP's office and given prednisone and zpack on 3/1 no improvement. Pt also states he was started on 3 new BP medications. Pt tachypneic in triage, unable to talk in complete sentences without running out of breath.

## 2016-12-19 NOTE — ED Notes (Signed)
Pt using call bell; in to check on pt; c/o abd pain that he rates 9/10; says he doesn't know how much more pain he can take; MD notified; no new order given at this time

## 2016-12-19 NOTE — ED Notes (Signed)
Patient transported to X-ray 

## 2016-12-20 MED ORDER — FAMOTIDINE 40 MG PO TABS: 40.0000 mg | ORAL_TABLET | Freq: Every evening | ORAL | 1 refills | Status: DC

## 2016-12-20 MED ORDER — DICYCLOMINE HCL 20 MG PO TABS: 20.0000 mg | ORAL_TABLET | Freq: Three times a day (TID) | ORAL | 0 refills | Status: DC | PRN

## 2016-12-20 NOTE — ED Provider Notes (Signed)
Patient was mistakenly given the incorrect discharge paperwork yesterday. I was able to get in touch with Dr. Lamont Snowballifenbark. He had intended to give the patient a prescription for bentyl and pepcid.    Micheal SemenGraydon Cyrus Ramsburg, MD 12/20/16 [id number]

## 2016-12-20 NOTE — Discharge Instructions (Signed)
Please seek medical attention for any high fevers, chest pain, shortness of breath, change in behavior, persistent vomiting, bloody stool or any other new or concerning symptoms.  

## 2017-06-29 ENCOUNTER — Emergency Department
Admission: EM | Admit: 2017-06-29 | Discharge: 2017-06-29 | Disposition: A | Discharge status: Home / Self Care | Payer: Self-pay | Attending: Emergency Medicine | Admitting: Emergency Medicine

## 2017-06-29 ENCOUNTER — Emergency Department: Payer: Self-pay

## 2017-06-29 DIAGNOSIS — I10 Essential (primary) hypertension: Secondary | ICD-10-CM | POA: Insufficient documentation

## 2017-06-29 DIAGNOSIS — M1711 Unilateral primary osteoarthritis, right knee: Secondary | ICD-10-CM | POA: Insufficient documentation

## 2017-06-29 DIAGNOSIS — Z79899 Other long term (current) drug therapy: Secondary | ICD-10-CM | POA: Insufficient documentation

## 2017-06-29 MED ORDER — TRAMADOL HCL 50 MG PO TABS
50.0000 mg | ORAL_TABLET | Freq: Once | ORAL | Status: AC
  Administered 2017-06-29: 50 mg via ORAL
  Filled 2017-06-29: qty 1

## 2017-06-29 MED ORDER — MELOXICAM 15 MG PO TABS: 15.0000 mg | ORAL_TABLET | Freq: Every day | ORAL | 2 refills | Status: DC

## 2017-06-29 MED ORDER — NAPROXEN 500 MG PO TABS
500.0000 mg | ORAL_TABLET | Freq: Once | ORAL | Status: AC
  Administered 2017-06-29: 500 mg via ORAL
  Filled 2017-06-29: qty 1

## 2017-06-29 MED ORDER — TRAMADOL HCL 50 MG PO TABS: 50.0000 mg | ORAL_TABLET | Freq: Four times a day (QID) | ORAL | 0 refills | Status: DC | PRN

## 2017-06-29 NOTE — ED Triage Notes (Signed)
Pt c/o right knee pain for the past couple of months, worse in the past 2 weeks, states it just gives out on him,. Denies injury.
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
## 2018-08-22 ENCOUNTER — Encounter: Payer: Self-pay | Admitting: Emergency Medicine

## 2018-08-22 ENCOUNTER — Other Ambulatory Visit: Payer: Self-pay

## 2018-08-22 ENCOUNTER — Emergency Department
Admission: EM | Admit: 2018-08-22 | Discharge: 2018-08-22 | Disposition: A | Discharge status: Home / Self Care | Payer: Self-pay | Attending: Emergency Medicine | Admitting: Emergency Medicine

## 2018-08-22 ENCOUNTER — Emergency Department: Payer: Self-pay

## 2018-08-22 DIAGNOSIS — K2970 Gastritis, unspecified, without bleeding: Secondary | ICD-10-CM

## 2018-08-22 DIAGNOSIS — Z79899 Other long term (current) drug therapy: Secondary | ICD-10-CM | POA: Insufficient documentation

## 2018-08-22 DIAGNOSIS — R1013 Epigastric pain: Secondary | ICD-10-CM

## 2018-08-22 DIAGNOSIS — I10 Essential (primary) hypertension: Secondary | ICD-10-CM | POA: Insufficient documentation

## 2018-08-22 HISTORY — DX: Unspecified asthma, uncomplicated: J45.909

## 2018-08-22 LAB — CBC
HEMATOCRIT: 44 % (ref 39.0–52.0)
HEMOGLOBIN: 14.3 g/dL (ref 13.0–17.0)
MCH: 28.9 pg (ref 26.0–34.0)
MCHC: 32.5 g/dL (ref 30.0–36.0)
MCV: 89.1 fL (ref 80.0–100.0)
NRBC: 0 % (ref 0.0–0.2)
Platelets: 213 10*3/uL (ref 150–400)
RBC: 4.94 MIL/uL (ref 4.22–5.81)
RDW: 13.1 % (ref 11.5–15.5)
WBC: 6.2 10*3/uL (ref 4.0–10.5)

## 2018-08-22 LAB — URINALYSIS, COMPLETE (UACMP) WITH MICROSCOPIC
BACTERIA UA: NONE SEEN
BILIRUBIN URINE: NEGATIVE
Glucose, UA: NEGATIVE mg/dL
HGB URINE DIPSTICK: NEGATIVE
KETONES UR: NEGATIVE mg/dL
LEUKOCYTES UA: NEGATIVE
Nitrite: NEGATIVE
PROTEIN: NEGATIVE mg/dL
SPECIFIC GRAVITY, URINE: 1.021 (ref 1.005–1.030)
SQUAMOUS EPITHELIAL / LPF: NONE SEEN (ref 0–5)
pH: 6 (ref 5.0–8.0)

## 2018-08-22 LAB — COMPREHENSIVE METABOLIC PANEL
ALT: 34 U/L (ref 0–44)
AST: 32 U/L (ref 15–41)
Albumin: 4.3 g/dL (ref 3.5–5.0)
Alkaline Phosphatase: 89 U/L (ref 38–126)
Anion gap: 8 (ref 5–15)
BUN: 13 mg/dL (ref 6–20)
CHLORIDE: 104 mmol/L (ref 98–111)
CO2: 26 mmol/L (ref 22–32)
CREATININE: 0.95 mg/dL (ref 0.61–1.24)
Calcium: 9.8 mg/dL (ref 8.9–10.3)
GFR calc non Af Amer: >60 mL/min (ref >60)
Glucose, Bld: 118 mg/dL — ABNORMAL HIGH (ref 70–99)
POTASSIUM: 3.5 mmol/L (ref 3.5–5.1)
SODIUM: 138 mmol/L (ref 135–145)
Total Bilirubin: 1 mg/dL (ref 0.3–1.2)
Total Protein: 8 g/dL (ref 6.5–8.1)

## 2018-08-22 LAB — TROPONIN I: Troponin I: <0.03 ng/mL (ref <0.03)

## 2018-08-22 LAB — LIPASE, BLOOD: LIPASE: 40 U/L (ref 11–51)

## 2018-08-22 MED ORDER — IOPAMIDOL (ISOVUE-300) INJECTION 61%
30.0000 mL | Freq: Once | INTRAVENOUS | Status: AC | PRN
  Administered 2018-08-22: 30 mL via ORAL

## 2018-08-22 MED ORDER — MORPHINE SULFATE (PF) 4 MG/ML IV SOLN
4.0000 mg | Freq: Once | INTRAVENOUS | Status: AC
  Administered 2018-08-22: 4 mg via INTRAVENOUS
  Filled 2018-08-22: qty 1

## 2018-08-22 MED ORDER — SODIUM CHLORIDE 0.9 % IV BOLUS
1000.0000 mL | Freq: Once | INTRAVENOUS | Status: AC
  Administered 2018-08-22: 1000 mL via INTRAVENOUS

## 2018-08-22 MED ORDER — OXYCODONE-ACETAMINOPHEN 5-325 MG PO TABS: 1.0000 | ORAL_TABLET | ORAL | 0 refills | Status: DC | PRN

## 2018-08-22 MED ORDER — IOHEXOL 300 MG/ML  SOLN
100.0000 mL | Freq: Once | INTRAMUSCULAR | Status: AC | PRN
  Administered 2018-08-22: 100 mL via INTRAVENOUS

## 2018-08-22 MED ORDER — ALUM & MAG HYDROXIDE-SIMETH 200-200-20 MG/5ML PO SUSP
30.0000 mL | Freq: Once | ORAL | Status: AC
  Administered 2018-08-22: 30 mL via ORAL
  Filled 2018-08-22: qty 30

## 2018-08-22 MED ORDER — ONDANSETRON HCL 4 MG/2ML IJ SOLN
4.0000 mg | Freq: Once | INTRAMUSCULAR | Status: AC
  Administered 2018-08-22: 4 mg via INTRAVENOUS
  Filled 2018-08-22: qty 2

## 2018-08-22 MED ORDER — LIDOCAINE VISCOUS HCL 2 % MT SOLN
15.0000 mL | Freq: Once | OROMUCOSAL | Status: AC
  Administered 2018-08-22: 15 mL via ORAL
  Filled 2018-08-22: qty 15

## 2018-08-22 MED ORDER — PANTOPRAZOLE SODIUM 40 MG PO TBEC: 40.0000 mg | DELAYED_RELEASE_TABLET | Freq: Every day | ORAL | 1 refills | Status: DC
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
## 2018-08-22 NOTE — ED Notes (Signed)
Patient transported to CT 

## 2018-08-22 NOTE — ED Triage Notes (Signed)
abd pain 4 days.

## 2018-08-22 NOTE — ED Triage Notes (Signed)
Pt arrived via POV with reports of 3 days of epigastric abdominal pain.  Pt denies any vomiting or diarrhea, pt states he feels nauseated. Pt describes the pain as a stabbing pain.  Pt reports poor PO intake as well.

## 2018-08-25 ENCOUNTER — Encounter: Payer: Self-pay | Admitting: Gastroenterology

## 2018-11-18 ENCOUNTER — Other Ambulatory Visit: Payer: Self-pay

## 2018-11-18 ENCOUNTER — Emergency Department
Admission: EM | Admit: 2018-11-18 | Discharge: 2018-11-18 | Disposition: A | Discharge status: Home / Self Care | Payer: Self-pay | Attending: Emergency Medicine | Admitting: Emergency Medicine

## 2018-11-18 ENCOUNTER — Encounter: Payer: Self-pay | Admitting: Intensive Care

## 2018-11-18 ENCOUNTER — Emergency Department: Payer: Worker's Compensation

## 2018-11-18 DIAGNOSIS — Z87891 Personal history of nicotine dependence: Secondary | ICD-10-CM | POA: Insufficient documentation

## 2018-11-18 DIAGNOSIS — X503XXA Overexertion from repetitive movements, initial encounter: Secondary | ICD-10-CM | POA: Insufficient documentation

## 2018-11-18 DIAGNOSIS — Y9389 Activity, other specified: Secondary | ICD-10-CM | POA: Insufficient documentation

## 2018-11-18 DIAGNOSIS — I10 Essential (primary) hypertension: Secondary | ICD-10-CM | POA: Insufficient documentation

## 2018-11-18 DIAGNOSIS — S39012A Strain of muscle, fascia and tendon of lower back, initial encounter: Secondary | ICD-10-CM | POA: Insufficient documentation

## 2018-11-18 DIAGNOSIS — Y990 Civilian activity done for income or pay: Secondary | ICD-10-CM | POA: Insufficient documentation

## 2018-11-18 DIAGNOSIS — Z79899 Other long term (current) drug therapy: Secondary | ICD-10-CM | POA: Insufficient documentation

## 2018-11-18 DIAGNOSIS — J45909 Unspecified asthma, uncomplicated: Secondary | ICD-10-CM | POA: Insufficient documentation

## 2018-11-18 DIAGNOSIS — Y929 Unspecified place or not applicable: Secondary | ICD-10-CM | POA: Insufficient documentation

## 2018-11-18 MED ORDER — TRAMADOL HCL 50 MG PO TABS: 50.0000 mg | ORAL_TABLET | Freq: Four times a day (QID) | ORAL | 0 refills | Status: DC | PRN

## 2018-11-18 MED ORDER — CYCLOBENZAPRINE HCL 10 MG PO TABS: 10.0000 mg | ORAL_TABLET | Freq: Three times a day (TID) | ORAL | 0 refills | Status: DC | PRN
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
## 2018-11-18 NOTE — ED Notes (Signed)
Patient transported to X-ray 

## 2018-11-18 NOTE — ED Notes (Signed)
Pt continues to complain of back pain from work injury 1 month ago. Pt ambulatory to room from lobby without difficulty.

## 2018-11-18 NOTE — ED Triage Notes (Signed)
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 Has been seen multiple times. Reports this is workmans comp. Ambulatory in triage with NAD noted

## 2019-01-01 ENCOUNTER — Other Ambulatory Visit: Payer: Self-pay

## 2019-01-01 ENCOUNTER — Encounter: Payer: Self-pay | Admitting: Emergency Medicine

## 2019-01-01 ENCOUNTER — Emergency Department
Admission: EM | Admit: 2019-01-01 | Discharge: 2019-01-01 | Disposition: A | Discharge status: Home / Self Care | Payer: Self-pay | Attending: Emergency Medicine | Admitting: Emergency Medicine

## 2019-01-01 DIAGNOSIS — Z79899 Other long term (current) drug therapy: Secondary | ICD-10-CM | POA: Insufficient documentation

## 2019-01-01 DIAGNOSIS — I10 Essential (primary) hypertension: Secondary | ICD-10-CM | POA: Insufficient documentation

## 2019-01-01 DIAGNOSIS — Z87891 Personal history of nicotine dependence: Secondary | ICD-10-CM | POA: Insufficient documentation

## 2019-01-01 DIAGNOSIS — J45909 Unspecified asthma, uncomplicated: Secondary | ICD-10-CM | POA: Insufficient documentation

## 2019-01-01 DIAGNOSIS — M5441 Lumbago with sciatica, right side: Secondary | ICD-10-CM | POA: Insufficient documentation

## 2019-01-01 MED ORDER — METHYLPREDNISOLONE SODIUM SUCC 125 MG IJ SOLR
125.0000 mg | Freq: Once | INTRAMUSCULAR | Status: AC
  Administered 2019-01-01: 125 mg via INTRAMUSCULAR
  Filled 2019-01-01: qty 2

## 2019-01-01 MED ORDER — KETOROLAC TROMETHAMINE 30 MG/ML IJ SOLN
30.0000 mg | Freq: Once | INTRAMUSCULAR | Status: AC
  Administered 2019-01-01: 30 mg via INTRAMUSCULAR
  Filled 2019-01-01: qty 1

## 2019-01-01 MED ORDER — TRAMADOL HCL 50 MG PO TABS: 50.0000 mg | ORAL_TABLET | Freq: Four times a day (QID) | ORAL | 0 refills | Status: AC | PRN

## 2019-01-01 MED ORDER — BACLOFEN 10 MG PO TABS: 10.0000 mg | ORAL_TABLET | Freq: Every day | ORAL | 0 refills | Status: AC

## 2019-01-01 MED ORDER — PREDNISONE 20 MG PO TABS: 60.0000 mg | ORAL_TABLET | Freq: Once | ORAL | Status: DC

## 2019-01-01 MED ORDER — LIDOCAINE 5 % EX PTCH
1.0000 | MEDICATED_PATCH | CUTANEOUS | Status: DC
  Administered 2019-01-01: 1 via TRANSDERMAL
  Filled 2019-01-01: qty 1

## 2019-01-01 MED ORDER — PREDNISONE 50 MG PO TABS: ORAL_TABLET | ORAL | 0 refills | Status: DC

## 2019-01-01 MED ORDER — NAPROXEN 500 MG PO TABS: 500.0000 mg | ORAL_TABLET | Freq: Two times a day (BID) | ORAL | 0 refills | Status: AC

## 2019-01-01 NOTE — ED Triage Notes (Signed)
Pt reports injured his back at work 10 weeks ago and he just went back to work on Tuesday. Pt states they were supposed to have him a seat to sit in but the did not and now his back is hurting again.   Pt reports works for [employer].
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
## 2019-01-13 ENCOUNTER — Other Ambulatory Visit: Payer: Self-pay

## 2019-01-13 ENCOUNTER — Emergency Department
Admission: EM | Admit: 2019-01-13 | Discharge: 2019-01-13 | Disposition: A | Discharge status: Home / Self Care | Payer: Self-pay | Attending: Emergency Medicine | Admitting: Emergency Medicine

## 2019-01-13 ENCOUNTER — Encounter: Payer: Self-pay | Admitting: Emergency Medicine

## 2019-01-13 DIAGNOSIS — Z87891 Personal history of nicotine dependence: Secondary | ICD-10-CM | POA: Insufficient documentation

## 2019-01-13 DIAGNOSIS — Z79899 Other long term (current) drug therapy: Secondary | ICD-10-CM | POA: Insufficient documentation

## 2019-01-13 DIAGNOSIS — J45909 Unspecified asthma, uncomplicated: Secondary | ICD-10-CM | POA: Insufficient documentation

## 2019-01-13 DIAGNOSIS — I10 Essential (primary) hypertension: Secondary | ICD-10-CM | POA: Insufficient documentation

## 2019-01-13 DIAGNOSIS — J40 Bronchitis, not specified as acute or chronic: Secondary | ICD-10-CM | POA: Insufficient documentation

## 2019-01-13 MED ORDER — FLUTICASONE PROPIONATE 50 MCG/ACT NA SUSP: 2.0000 | Freq: Every day | NASAL | 0 refills | Status: DC

## 2019-01-13 MED ORDER — BENZONATATE 100 MG PO CAPS: ORAL_CAPSULE | ORAL | 0 refills | Status: DC

## 2019-01-13 NOTE — ED Notes (Signed)
Paper D/C instructions and work note given to patient.

## 2019-01-13 NOTE — ED Triage Notes (Signed)
Patient reports a cough, nasal congestion, and shortness of breath that began several days ago.  Patient is in no obvious distress at this time.  Speaking in full sentences and respirations are even and not labored at this time.
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
## 2020-02-09 ENCOUNTER — Observation Stay
Admission: EM | Admit: 2020-02-09 | Discharge: 2020-02-10 | Disposition: A | Discharge status: Home / Self Care | Payer: Medicaid Other | Attending: Internal Medicine | Admitting: Internal Medicine

## 2020-02-09 ENCOUNTER — Observation Stay: Payer: Medicaid Other

## 2020-02-09 ENCOUNTER — Emergency Department: Payer: Medicaid Other

## 2020-02-09 ENCOUNTER — Other Ambulatory Visit: Payer: Self-pay

## 2020-02-09 DIAGNOSIS — Z79899 Other long term (current) drug therapy: Secondary | ICD-10-CM | POA: Insufficient documentation

## 2020-02-09 DIAGNOSIS — F1010 Alcohol abuse, uncomplicated: Secondary | ICD-10-CM | POA: Insufficient documentation

## 2020-02-09 DIAGNOSIS — Z7952 Long term (current) use of systemic steroids: Secondary | ICD-10-CM | POA: Insufficient documentation

## 2020-02-09 DIAGNOSIS — J45909 Unspecified asthma, uncomplicated: Secondary | ICD-10-CM | POA: Diagnosis present

## 2020-02-09 DIAGNOSIS — Z7289 Other problems related to lifestyle: Secondary | ICD-10-CM

## 2020-02-09 DIAGNOSIS — Z87891 Personal history of nicotine dependence: Secondary | ICD-10-CM | POA: Insufficient documentation

## 2020-02-09 DIAGNOSIS — K219 Gastro-esophageal reflux disease without esophagitis: Secondary | ICD-10-CM | POA: Diagnosis not present

## 2020-02-09 DIAGNOSIS — E785 Hyperlipidemia, unspecified: Secondary | ICD-10-CM | POA: Insufficient documentation

## 2020-02-09 DIAGNOSIS — F1090 Alcohol use, unspecified, uncomplicated: Secondary | ICD-10-CM

## 2020-02-09 DIAGNOSIS — R29898 Other symptoms and signs involving the musculoskeletal system: Secondary | ICD-10-CM

## 2020-02-09 DIAGNOSIS — I639 Cerebral infarction, unspecified: Secondary | ICD-10-CM | POA: Diagnosis not present

## 2020-02-09 DIAGNOSIS — I6521 Occlusion and stenosis of right carotid artery: Secondary | ICD-10-CM | POA: Insufficient documentation

## 2020-02-09 DIAGNOSIS — Z789 Other specified health status: Secondary | ICD-10-CM

## 2020-02-09 DIAGNOSIS — I119 Hypertensive heart disease without heart failure: Secondary | ICD-10-CM | POA: Insufficient documentation

## 2020-02-09 DIAGNOSIS — Z20822 Contact with and (suspected) exposure to covid-19: Secondary | ICD-10-CM | POA: Diagnosis not present

## 2020-02-09 DIAGNOSIS — Z791 Long term (current) use of non-steroidal anti-inflammatories (NSAID): Secondary | ICD-10-CM | POA: Insufficient documentation

## 2020-02-09 DIAGNOSIS — Z8719 Personal history of other diseases of the digestive system: Secondary | ICD-10-CM | POA: Insufficient documentation

## 2020-02-09 DIAGNOSIS — N179 Acute kidney failure, unspecified: Secondary | ICD-10-CM | POA: Diagnosis not present

## 2020-02-09 DIAGNOSIS — R200 Anesthesia of skin: Secondary | ICD-10-CM

## 2020-02-09 DIAGNOSIS — I10 Essential (primary) hypertension: Secondary | ICD-10-CM

## 2020-02-09 LAB — CBC WITH DIFFERENTIAL/PLATELET
Abs Immature Granulocytes: 0.03 10*3/uL (ref 0.00–0.07)
Basophils Absolute: 0 10*3/uL (ref 0.0–0.1)
Basophils Relative: 1 %
Eosinophils Absolute: 0.2 10*3/uL (ref 0.0–0.5)
Eosinophils Relative: 3 %
HCT: 43.7 % (ref 39.0–52.0)
Hemoglobin: 14.6 g/dL (ref 13.0–17.0)
Immature Granulocytes: 1 %
Lymphocytes Relative: 38 %
Lymphs Abs: 2 10*3/uL (ref 0.7–4.0)
MCH: 30.5 pg (ref 26.0–34.0)
MCHC: 33.4 g/dL (ref 30.0–36.0)
MCV: 91.4 fL (ref 80.0–100.0)
Monocytes Absolute: 0.5 10*3/uL (ref 0.1–1.0)
Monocytes Relative: 9 %
Neutro Abs: 2.5 10*3/uL (ref 1.7–7.7)
Neutrophils Relative %: 48 %
Platelets: 216 10*3/uL (ref 150–400)
RBC: 4.78 MIL/uL (ref 4.22–5.81)
RDW: 13.7 % (ref 11.5–15.5)
WBC: 5.1 10*3/uL (ref 4.0–10.5)
nRBC: 0 % (ref 0.0–0.2)

## 2020-02-09 LAB — BASIC METABOLIC PANEL
Anion gap: 10 (ref 5–15)
BUN: 15 mg/dL (ref 6–20)
CO2: 27 mmol/L (ref 22–32)
Calcium: 10.3 mg/dL (ref 8.9–10.3)
Chloride: 104 mmol/L (ref 98–111)
Creatinine, Ser: 1.37 mg/dL — ABNORMAL HIGH (ref 0.61–1.24)
GFR calc Af Amer: >60 mL/min (ref >60)
GFR calc non Af Amer: 56 mL/min — ABNORMAL LOW (ref >60)
Glucose, Bld: 117 mg/dL — ABNORMAL HIGH (ref 70–99)
Potassium: 3.3 mmol/L — ABNORMAL LOW (ref 3.5–5.1)
Sodium: 141 mmol/L (ref 135–145)

## 2020-02-09 LAB — URINE DRUG SCREEN, QUALITATIVE (ARMC ONLY)
Amphetamines, Ur Screen: NOT DETECTED
Barbiturates, Ur Screen: NOT DETECTED
Benzodiazepine, Ur Scrn: NOT DETECTED
Cannabinoid 50 Ng, Ur ~~LOC~~: POSITIVE — AB
Cocaine Metabolite,Ur ~~LOC~~: POSITIVE — AB
MDMA (Ecstasy)Ur Screen: NOT DETECTED
Methadone Scn, Ur: NOT DETECTED
Opiate, Ur Screen: NOT DETECTED
Phencyclidine (PCP) Ur S: NOT DETECTED
Tricyclic, Ur Screen: NOT DETECTED

## 2020-02-09 LAB — SARS CORONAVIRUS 2 (TAT 6-24 HRS): SARS Coronavirus 2: NEGATIVE

## 2020-02-09 LAB — TROPONIN I (HIGH SENSITIVITY): Troponin I (High Sensitivity): 16 ng/L (ref <18)

## 2020-02-09 MED ORDER — STROKE: EARLY STAGES OF RECOVERY BOOK: Freq: Once | Status: AC

## 2020-02-09 MED ORDER — ACETAMINOPHEN 160 MG/5ML PO SOLN
650.0000 mg | ORAL | Status: DC | PRN
  Filled 2020-02-09: qty 20.3

## 2020-02-09 MED ORDER — POTASSIUM CHLORIDE CRYS ER 20 MEQ PO TBCR
20.0000 meq | EXTENDED_RELEASE_TABLET | Freq: Once | ORAL | Status: AC
  Administered 2020-02-09: 20 meq via ORAL
  Filled 2020-02-09: qty 1

## 2020-02-09 MED ORDER — LORAZEPAM 1 MG PO TABS: 1.0000 mg | ORAL_TABLET | ORAL | Status: DC | PRN

## 2020-02-09 MED ORDER — ACETAMINOPHEN 650 MG RE SUPP: 650.0000 mg | RECTAL | Status: DC | PRN

## 2020-02-09 MED ORDER — FOLIC ACID 1 MG PO TABS
1.0000 mg | ORAL_TABLET | Freq: Every day | ORAL | Status: DC
  Administered 2020-02-09 – 2020-02-10 (×2): 1 mg via ORAL
  Filled 2020-02-09 (×2): qty 1

## 2020-02-09 MED ORDER — ASPIRIN 81 MG PO CHEW
324.0000 mg | CHEWABLE_TABLET | Freq: Once | ORAL | Status: AC
  Administered 2020-02-09: 324 mg via ORAL
  Filled 2020-02-09: qty 4

## 2020-02-09 MED ORDER — ASPIRIN 300 MG RE SUPP: 300.0000 mg | Freq: Every day | RECTAL | Status: DC

## 2020-02-09 MED ORDER — CLOPIDOGREL BISULFATE 75 MG PO TABS
75.0000 mg | ORAL_TABLET | Freq: Every day | ORAL | Status: DC
  Administered 2020-02-09 – 2020-02-10 (×2): 75 mg via ORAL
  Filled 2020-02-09 (×2): qty 1

## 2020-02-09 MED ORDER — IPRATROPIUM BROMIDE 0.02 % IN SOLN
0.5000 mg | Freq: Four times a day (QID) | RESPIRATORY_TRACT | Status: DC
  Administered 2020-02-09 – 2020-02-10 (×4): 0.5 mg via RESPIRATORY_TRACT
  Filled 2020-02-09 (×4): qty 2.5

## 2020-02-09 MED ORDER — ALBUTEROL SULFATE HFA 108 (90 BASE) MCG/ACT IN AERS: 2.0000 | INHALATION_SPRAY | RESPIRATORY_TRACT | Status: DC | PRN

## 2020-02-09 MED ORDER — THIAMINE HCL 100 MG PO TABS
100.0000 mg | ORAL_TABLET | Freq: Every day | ORAL | Status: DC
  Administered 2020-02-09 – 2020-02-10 (×2): 100 mg via ORAL
  Filled 2020-02-09 (×3): qty 1

## 2020-02-09 MED ORDER — SENNOSIDES-DOCUSATE SODIUM 8.6-50 MG PO TABS: 1.0000 | ORAL_TABLET | Freq: Every evening | ORAL | Status: DC | PRN

## 2020-02-09 MED ORDER — ALBUTEROL SULFATE (2.5 MG/3ML) 0.083% IN NEBU: 2.5000 mg | INHALATION_SOLUTION | RESPIRATORY_TRACT | Status: DC | PRN

## 2020-02-09 MED ORDER — HYDRALAZINE HCL 20 MG/ML IJ SOLN: 5.0000 mg | INTRAMUSCULAR | Status: DC | PRN

## 2020-02-09 MED ORDER — ADULT MULTIVITAMIN W/MINERALS CH
1.0000 | ORAL_TABLET | Freq: Every day | ORAL | Status: DC
  Administered 2020-02-09 – 2020-02-10 (×2): 1 via ORAL
  Filled 2020-02-09 (×3): qty 1

## 2020-02-09 MED ORDER — ONDANSETRON HCL 4 MG/2ML IJ SOLN: 4.0000 mg | Freq: Three times a day (TID) | INTRAMUSCULAR | Status: DC | PRN

## 2020-02-09 MED ORDER — THIAMINE HCL 100 MG/ML IJ SOLN
100.0000 mg | Freq: Every day | INTRAMUSCULAR | Status: DC
  Filled 2020-02-09: qty 2

## 2020-02-09 MED ORDER — SODIUM CHLORIDE 0.9 % IV SOLN: INTRAVENOUS | Status: DC

## 2020-02-09 MED ORDER — LORAZEPAM 2 MG/ML IJ SOLN: 0.0000 mg | Freq: Two times a day (BID) | INTRAMUSCULAR | Status: DC

## 2020-02-09 MED ORDER — TIZANIDINE HCL 4 MG PO TABS
4.0000 mg | ORAL_TABLET | Freq: Three times a day (TID) | ORAL | Status: DC | PRN
  Filled 2020-02-09: qty 1

## 2020-02-09 MED ORDER — ASPIRIN EC 325 MG PO TBEC: 325.0000 mg | DELAYED_RELEASE_TABLET | Freq: Every day | ORAL | Status: DC

## 2020-02-09 MED ORDER — ENOXAPARIN SODIUM 40 MG/0.4ML ~~LOC~~ SOLN
40.0000 mg | SUBCUTANEOUS | Status: DC
  Administered 2020-02-09: 22:00:00 40 mg via SUBCUTANEOUS
  Filled 2020-02-09: qty 0.4

## 2020-02-09 MED ORDER — ASPIRIN 325 MG PO TABS: 325.0000 mg | ORAL_TABLET | Freq: Every day | ORAL | Status: DC

## 2020-02-09 MED ORDER — LORAZEPAM 2 MG/ML IJ SOLN: 0.0000 mg | Freq: Four times a day (QID) | INTRAMUSCULAR | Status: DC

## 2020-02-09 MED ORDER — IPRATROPIUM BROMIDE HFA 17 MCG/ACT IN AERS: 2.0000 | INHALATION_SPRAY | Freq: Four times a day (QID) | RESPIRATORY_TRACT | Status: DC

## 2020-02-09 MED ORDER — ACETAMINOPHEN 325 MG PO TABS: 650.0000 mg | ORAL_TABLET | ORAL | Status: DC | PRN

## 2020-02-09 MED ORDER — ATORVASTATIN CALCIUM 20 MG PO TABS
40.0000 mg | ORAL_TABLET | Freq: Every day | ORAL | Status: DC
  Administered 2020-02-09 – 2020-02-10 (×2): 40 mg via ORAL
  Filled 2020-02-09 (×2): qty 2

## 2020-02-09 MED ORDER — LORAZEPAM 2 MG/ML IJ SOLN: 1.0000 mg | INTRAMUSCULAR | Status: DC | PRN

## 2020-02-09 MED ORDER — ASPIRIN EC 81 MG PO TBEC
81.0000 mg | DELAYED_RELEASE_TABLET | Freq: Every day | ORAL | Status: DC
  Administered 2020-02-10: 81 mg via ORAL
  Filled 2020-02-09: qty 1

## 2020-02-09 NOTE — ED Notes (Signed)
Pt trx to CT.  
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
## 2020-02-09 NOTE — Progress Notes (Signed)
SLP Cancellation Note  Patient Details Name: Micheal Alvarez MRN: [id number] DOB: [birth year]   Cancelled treatment:       Reason Eval/Treat Not Completed: SLP screened, no needs identified, will sign off(chart reviewed; consulted NSG and met w/ pt in room). Pt sitting in chair in room w/ shoes/pants on mildly rubbing at Left arm indicating some numbness/tinglng. Pt was verbal w/ NSG answering NIH assessment questions appropriately. Pt denied any difficulty swallowing and is currently on a regular diet after passing the [employer] screen w/ NSG; tolerates swallowing pills w/ water per NSG. Pt conversed at conversational level w/out deficits noted; pt and NSG denied any speech-language deficits.  No further skilled ST services indicated as pt appears at his baseline. Pt agreed. NSG to reconsult if any change in status.     Orinda Kenner, MS, CCC-SLP Jocelyn Lowery 02/09/2020, 4:22 PM

## 2020-02-09 NOTE — Progress Notes (Signed)
Report received from [place], [place]. Assumed care of pt. Admission and admission assessment completed. Pt. Sitting up in chair, food tray ordered. PT and speech have been by to assess pt. Both have signed off. Pt. Denies any pain or needs at this time. Will continue to monitor.

## 2020-02-09 NOTE — ED Notes (Signed)
Neurologist at bedside. 

## 2020-02-09 NOTE — ED Notes (Signed)
Assisted pt to use the bathroom.   

## 2020-02-09 NOTE — ED Triage Notes (Signed)
Pt c/o left arm, face and leg numbness since Thursday at 1am with a posterior HA. Pt is ambulatory to triage with a steady gait. No facial drooping noted.

## 2020-02-09 NOTE — ED Notes (Signed)
Pt trx to MRI  
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
## 2020-02-09 NOTE — ED Notes (Signed)
Pt was watching TV around 1:00 AM and suddenly experienced numbness on left side of face and arm. Pt has hx of HTN and states he is compliant w/ meds. Pt has hx of hyperlipidemia and has been out of meds "for months"

## 2020-02-09 NOTE — ED Notes (Signed)
Provided pt w/ warm blanket and TV remote.

## 2020-02-10 ENCOUNTER — Observation Stay (HOSPITAL_BASED_OUTPATIENT_CLINIC_OR_DEPARTMENT_OTHER)
Admit: 2020-02-10 | Discharge: 2020-02-10 | Disposition: A | Discharge status: Home / Self Care | Payer: Medicaid Other | Attending: Internal Medicine | Admitting: Internal Medicine

## 2020-02-10 DIAGNOSIS — I361 Nonrheumatic tricuspid (valve) insufficiency: Secondary | ICD-10-CM

## 2020-02-10 LAB — BASIC METABOLIC PANEL
Anion gap: 7 (ref 5–15)
BUN: 12 mg/dL (ref 6–20)
CO2: 26 mmol/L (ref 22–32)
Calcium: 9.6 mg/dL (ref 8.9–10.3)
Chloride: 104 mmol/L (ref 98–111)
Creatinine, Ser: 1.25 mg/dL — ABNORMAL HIGH (ref 0.61–1.24)
GFR calc Af Amer: >60 mL/min (ref >60)
GFR calc non Af Amer: >60 mL/min (ref >60)
Glucose, Bld: 100 mg/dL — ABNORMAL HIGH (ref 70–99)
Potassium: 3.1 mmol/L — ABNORMAL LOW (ref 3.5–5.1)
Sodium: 137 mmol/L (ref 135–145)

## 2020-02-10 LAB — ECHOCARDIOGRAM COMPLETE
Height: 65 in
Weight: 3520 oz

## 2020-02-10 LAB — LIPID PANEL
Cholesterol: 202 mg/dL — ABNORMAL HIGH (ref 0–200)
HDL: 43 mg/dL (ref >40)
LDL Cholesterol: 124 mg/dL — ABNORMAL HIGH (ref 0–99)
Total CHOL/HDL Ratio: 4.7 RATIO
Triglycerides: 176 mg/dL — ABNORMAL HIGH (ref <150)
VLDL: 35 mg/dL (ref 0–40)

## 2020-02-10 LAB — HIV ANTIBODY (ROUTINE TESTING W REFLEX): HIV Screen 4th Generation wRfx: NONREACTIVE

## 2020-02-10 LAB — HEMOGLOBIN A1C
Hgb A1c MFr Bld: 5.5 % (ref 4.8–5.6)
Mean Plasma Glucose: 111.15 mg/dL

## 2020-02-10 LAB — MAGNESIUM: Magnesium: 2 mg/dL (ref 1.7–2.4)

## 2020-02-10 MED ORDER — AMLODIPINE BESYLATE 10 MG PO TABS
10.0000 mg | ORAL_TABLET | Freq: Every day | ORAL | 1 refills | Status: DC
Start: 2020-02-10 — End: 2023-03-04

## 2020-02-10 MED ORDER — ATORVASTATIN CALCIUM 40 MG PO TABS: 40.0000 mg | ORAL_TABLET | Freq: Every day | ORAL | 1 refills | Status: DC

## 2020-02-10 MED ORDER — IPRATROPIUM BROMIDE 0.02 % IN SOLN: 0.5000 mg | Freq: Two times a day (BID) | RESPIRATORY_TRACT | Status: DC

## 2020-02-10 MED ORDER — CLOPIDOGREL BISULFATE 75 MG PO TABS: 75.0000 mg | ORAL_TABLET | Freq: Every day | ORAL | 0 refills | Status: AC

## 2020-02-10 MED ORDER — ASPIRIN 81 MG PO TBEC: 81.0000 mg | DELAYED_RELEASE_TABLET | Freq: Every day | ORAL | 1 refills | Status: AC

## 2020-02-10 MED ORDER — POTASSIUM CHLORIDE CRYS ER 20 MEQ PO TBCR
40.0000 meq | EXTENDED_RELEASE_TABLET | ORAL | Status: AC
  Administered 2020-02-10 (×2): 40 meq via ORAL
  Filled 2020-02-10 (×2): qty 2

## 2020-02-10 NOTE — Discharge Summary (Addendum)
Physician Discharge Summary  Micheal Alvarez SAY:[id number] DOB: 12/22/1960 DOA: 02/09/2020  PCP: [facility]  Admit date: 02/09/2020 Discharge date: 02/10/2020  Admitted From: Home Disposition:  Home  Recommendations for Outpatient Follow-up:  1. Follow up with PCP in 1-2 weeks 2. Please obtain BMP/CBC in one week 3. Please follow up on the following pending results: None  Home Health: No Equipment/Devices: Discharge Condition:  CODE STATUS:  Diet recommendation: Heart Healthy / Carb Modified / Regular / Dysphagia   Brief/Interim Summary: Micheal Alvarez is a 59 y.o. male with medical history significant of hypertension, hyperlipidemia, asthma, GERD, pancreatitis, consult wound of chest, alcohol use, who presents with left arm numbness and weakness.  Patient symptoms started this morning at about 1 AM, on the day of admission, including left-sided weakness and numbness in both left arm and left leg.  The symptoms is worse in left arm.  He also has left facial numbness.  No difficulty speaking, vision loss or hearing loss. Patient also has posterior headache.  Patient does not have chest pain, shortness breath, cough, fever or chills.  No nausea vomiting, diarrhea, abdominal pain, symptoms of UTI or unilateral weakness.  Initial CT head was normal but he found to have a small left subcortical infarct on MRI.  MRA with significant intracranial stenosis.  UDS was positive for cocaine and cannabinoid.  Blood pressure was elevated with lipid profile showing elevated total cholesterol of 202 with LDL of 124.  We held his home antihypertensives for permissive hypertension and he will resume from tomorrow. We increased the dose of Lipitor to 40 mg daily.  He was also given aspirin and Plavix for 1 month and then he should continue with aspirin only. His symptoms improved and at the time of discharge he was having mild tingling of his left hand fingers and left side of mouth.   No other focal deficit. He was also counseled against the use of cocaine and will need continuous reminding.  A1c was 5.5.  Echocardiogram was done to complete the stroke work-up and it was within normal limits.  He will continue rest of his home meds.  And will need a close follow-up with PCP for further management and risks of reduction.  Discharge Diagnoses:  Principal Problem:   Stroke [name] Active Problems:   Hypertension   Asthma   HLD (hyperlipidemia)   GERD (gastroesophageal reflux disease)   Alcohol use   AKI (acute kidney injury) [facility])  Discharge Instructions  Discharge Instructions    Diet - low sodium heart healthy   Complete by: As directed    Discharge instructions   Complete by: As directed    It was pleasure taking care of you. You will take aspirin and Plavix together for 1 month and then just continue with aspirin. You will continue with Lipitor to help with your cholesterol. It is very important that you stay away from cocaine as it can cause a big stroke or an heart attack. I separated your combined pill cardat for your high blood pressure and cholesterol as we are giving you a higher dose of cholesterol medicine. Please follow-up with your primary care physician closely to monitor your progress.   Increase activity slowly   Complete by: As directed      Allergies as of 02/10/2020   No Known Allergies     Medication List    STOP taking these medications   Caduet 10-20 MG tablet Generic drug: amlodipine-atorvastatin   diclofenac 75 MG EC  tablet Commonly known as: VOLTAREN     TAKE these medications   albuterol 108 (90 Base) MCG/ACT inhaler Commonly known as: VENTOLIN HFA Inhale 2 puffs into the lungs every 6 (six) hours as needed for wheezing or shortness of breath.   amLODipine 10 MG tablet Commonly known as: NORVASC Take 1 tablet (10 mg total) by mouth daily.   aspirin 81 MG EC tablet Take 1 tablet (81 mg total) by mouth daily. Start  taking on: February 11, 2020   atorvastatin 40 MG tablet Commonly known as: LIPITOR Take 1 tablet (40 mg total) by mouth daily. Start taking on: February 11, 2020   clopidogrel 75 MG tablet Commonly known as: PLAVIX Take 1 tablet (75 mg total) by mouth daily. Start taking on: February 11, 2020   hydrALAZINE 25 MG tablet Commonly known as: APRESOLINE Take 25 mg by mouth in the morning and at bedtime.   tiZANidine 4 MG tablet Commonly known as: ZANAFLEX Take 4 mg by mouth every 8 (eight) hours as needed for muscle spasms.      Follow-up Information    Schedule an appointment as soon as possible for a visit with [facility].   Why: Patient to make own follow up appt due to pffice being closed at this time Contact information: [address] [place] [address] [phone]          No Known Allergies  Consultations:  Neurology  Procedures/Studies: CT Head Wo Contrast  Result Date: 02/09/2020 CLINICAL DATA:  Acute onset of left facial numbness and left arm numbness. EXAM: CT HEAD WITHOUT CONTRAST TECHNIQUE: Contiguous axial images were obtained from the base of the skull through the vertex without intravenous contrast. COMPARISON:  None. FINDINGS: Brain: The ventricles are normal in size and configuration. No extra-axial fluid collections are identified. The gray-white differentiation is maintained. No CT findings for acute hemispheric infarction or intracranial hemorrhage. No mass lesions. The brainstem and cerebellum are normal. Vascular: Age advanced vascular calcifications but no aneurysm or hyperdense vessels. Skull: No acute skull fracture. No bone lesion. Sinuses/Orbits: The paranasal sinuses and mastoid air cells are clear. The globes are intact. Other: No scalp lesions, laceration or hematoma. IMPRESSION: 1. No acute intracranial findings or mass lesions. 2. Age advanced vascular calcifications. Electronically Signed   By: Rudie Meyer M.D.   On:  02/09/2020 09:06   MR ANGIO HEAD WO CONTRAST  Result Date: 02/09/2020 CLINICAL DATA:  Stroke, follow-up stroke. Additional history provided: Left-sided weakness and numbness in both left arm and left leg, left facial numbness EXAM: MRI HEAD WITHOUT CONTRAST MRA HEAD WITHOUT CONTRAST TECHNIQUE: Multiplanar, multiecho pulse sequences of the brain and surrounding structures were obtained without intravenous contrast. Angiographic images of the head were obtained using MRA technique without contrast. COMPARISON:  Noncontrast head CT 02/09/2019 FINDINGS: MRI HEAD FINDINGS Brain: The examination is intermittently motion degraded. Most notably there is moderate motion degradation of the sagittal T1 weighted sequence. There is a 4 mm focus of restricted diffusion within the right thalamocapsular junction consistent with acute infarct (series 2, image 73) (series 4, image 64). Corresponding T2/FLAIR hyperintensity at this site. No evidence of intracranial mass. No midline shift or extra-axial fluid collection. No chronic intracranial blood products. Additional mild scattered T2/FLAIR hyperintensity within the cerebral white matter is nonspecific, but consistent with chronic small vessel ischemic disease. Cerebral volume is normal for age. Vascular: Reported below. Skull and upper cervical spine: Within limitations of motion degradation, no focal marrow lesion is  identified. Incompletely assessed upper cervical spondylosis. Sinuses/Orbits: Visualized orbits demonstrate no acute abnormality. Mild ethmoid and maxillary sinus mucosal thickening. No significant mastoid effusion. MRA HEAD FINDINGS The visualized distal cervical and intracranial right internal carotid artery is asymmetrically diminutive, which may reflect a significant stenosis more proximally within the neck. The intracranial right ICA is patent. However, there is marked atherosclerotic irregularity of the cavernous and paraclinoid segments with severe  stenosis. The intracranial left internal carotid artery is patent without significant stenosis. The M1 middle cerebral arteries are patent without significant stenosis. No proximal M2 branch occlusion is identified. Atherosclerotic irregularity of the M2 and more distal MCA branch vessels bilaterally. Most notably there are sites of moderate/severe stenosis within multiple bilateral proximal M2 branches. The anterior cerebral arteries are patent bilaterally. The A1 right ACA is hypoplastic. There is a moderate/severe focal stenosis within the A2 left ACA (series 6, image 143) (series 1026, image 19). No intracranial aneurysm is identified. The intracranial vertebral arteries are patent bilaterally. The right vertebral artery is dominant. There is moderate atherosclerotic narrowing within the distal V4 left vertebral artery just proximal to the vertebrobasilar junction. The basilar artery is patent without significant stenosis. The posterior cerebral arteries are patent without significant proximal stenosis. Posterior communicating arteries are present bilaterally. IMPRESSION: MRI brain: 1. Intermittently motion degraded examination. 2. 4 mm acute infarct within the right thalamocapsular junction. 3. Background mild chronic small vessel ischemic disease within the cerebral white matter. 4. Mild ethmoid and maxillary sinus mucosal thickening. MRA head: 1. The visualized distal cervical and intracranial right ICA is asymmetrically diminutive, which may reflect the presence of significant stenosis more proximally within the neck. 2. Marked atherosclerotic irregularity of the cavernous and paraclinoid right ICA with severe stenosis. 3. Atherosclerotic irregularity of the M2 and more distal MCA branch vessels bilaterally. Most notably, there are moderate/severe stenoses within multiple bilateral proximal M2 branches. 4. Moderate/severe focal stenosis within the A2 left anterior cerebral artery. 5. Moderate focal stenosis  within the distal V4 left vertebral artery. 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 Larinda Buttery at 1:40 p.m. on 02/09/2020. Recommendation for carotid artery duplex or CTA of the neck also communicated. Electronically Signed   By: Jackey Loge DO   On: 02/09/2020 20:32   Result Date: 02/09/2020 CLINICAL DATA:  Stroke, follow-up. EXAM: MRA NECK WITHOUT CONTRAST TECHNIQUE: Angiographic images of the neck were obtained using MRA technique without intravenous contrast. Carotid stenosis measurements (when applicable) are obtained utilizing NASCET criteria, using the distal internal carotid diameter as the denominator. COMPARISON:  Concurrently performed MRI/MRA head FINDINGS: Please note the origins of the common carotid arteries and left vertebral artery are excluded from the field of view. Additionally, the internal carotid arteries and vertebral arteries are excluded from the field of view distally within the neck. Evaluation is limited by noncontrast technique and motion degradation. The visualized common carotid arteries are patent within the neck to the bifurcation without appreciable hemodynamically significant stenosis. There is suggestion of a potentially significant stenosis within the right carotid bifurcation/proximal ICA. No appreciable significant stenosis of the proximal left ICA (50% or greater). The visualized vertebral arteries are patent with antegrade flow. The right vertebral artery is dominant. No  definite significant stenosis identified within the visualized cervical right vertebral artery (50% or greater). Suggestion of multifocal stenoses within the non dominant left  cervical vertebral artery which are poorly quantified on this noncontrast and motion degraded study. IMPRESSION: Examination significantly limited by non-contrast technique and motion degradation. Suspected hemodynamically significant stenosis within the right carotid bifurcation/proximal right ICA. Carotid artery duplex or CTA is recommended for further evaluation, as clinically warranted. The visualized cervical vertebral arteries are patent with antegrade flow. Probable multifocal stenoses within the non dominant cervical left vertebral artery which are poorly quantified on this motion degraded noncontrast study. Electronically Signed: By: Kellie Simmering DO On: 02/09/2020 13:37   MR BRAIN WO CONTRAST  Result Date: 02/09/2020 CLINICAL DATA:  Stroke, follow-up stroke. Additional history provided: Left-sided weakness and numbness in both left arm and left leg, left facial numbness EXAM: MRI HEAD WITHOUT CONTRAST MRA HEAD WITHOUT CONTRAST TECHNIQUE: Multiplanar, multiecho pulse sequences of the brain and surrounding structures were obtained without intravenous contrast. Angiographic images of the head were obtained using MRA technique without contrast. COMPARISON:  Noncontrast head CT 02/09/2019 FINDINGS: MRI HEAD FINDINGS Brain: The examination is intermittently motion degraded. Most notably there is moderate motion degradation of the sagittal T1 weighted sequence. There is a 4 mm focus of restricted diffusion within the right thalamocapsular junction consistent with acute infarct (series 2, image 73) (series 4, image 64). Corresponding T2/FLAIR hyperintensity at this site. No evidence of intracranial mass. No midline shift or extra-axial fluid collection. No chronic intracranial blood products. Additional mild scattered T2/FLAIR hyperintensity within the cerebral white matter is nonspecific, but consistent with chronic small vessel ischemic disease. Cerebral volume is normal for age. Vascular: Reported below.  Skull and upper cervical spine: Within limitations of motion degradation, no focal marrow lesion is identified. Incompletely assessed upper cervical spondylosis. Sinuses/Orbits: Visualized orbits demonstrate no acute abnormality. Mild ethmoid and maxillary sinus mucosal thickening. No significant mastoid effusion. MRA HEAD FINDINGS The visualized distal cervical and intracranial right internal carotid artery is asymmetrically diminutive, which may reflect a significant stenosis more proximally within the neck. The intracranial right ICA is patent. However, there is marked atherosclerotic irregularity of the cavernous and paraclinoid segments with severe stenosis. The intracranial left internal carotid artery is patent without significant stenosis. The M1 middle cerebral arteries are patent without significant stenosis. No proximal M2 branch occlusion is identified. Atherosclerotic irregularity of the M2 and more distal MCA branch vessels bilaterally. Most notably there are sites of moderate/severe stenosis within multiple bilateral proximal M2 branches. The anterior cerebral arteries are patent bilaterally. The A1 right ACA is hypoplastic. There is a moderate/severe focal stenosis within the A2 left ACA (series 6, image 143) (series 1026, image 19). No intracranial aneurysm is identified. The intracranial vertebral arteries are patent bilaterally. The right vertebral artery is dominant. There is moderate atherosclerotic narrowing within the distal V4 left vertebral artery just proximal to the vertebrobasilar junction. The basilar artery is patent without significant stenosis. The posterior cerebral arteries are patent without significant proximal stenosis. Posterior communicating arteries are present bilaterally. IMPRESSION: MRI brain: 1. Intermittently motion degraded examination. 2. 4 mm acute infarct within the right thalamocapsular junction. 3. Background mild chronic small vessel ischemic disease within the  cerebral white matter. 4. Mild ethmoid and maxillary sinus mucosal thickening. MRA head: 1. The visualized distal cervical and intracranial right ICA is asymmetrically diminutive, which may reflect the presence of significant stenosis more proximally within the neck. 2. Marked atherosclerotic irregularity of the cavernous and  paraclinoid right ICA with severe stenosis. 3. Atherosclerotic irregularity of the M2 and more distal MCA branch vessels bilaterally. Most notably, there are moderate/severe stenoses within multiple bilateral proximal M2 branches. 4. Moderate/severe focal stenosis within the A2 left anterior cerebral artery. 5. Moderate focal stenosis within the distal V4 left vertebral artery. Electronically Signed   By: Jackey Loge DO   On: 02/09/2020 13:25   ECHOCARDIOGRAM COMPLETE  Result Date: 02/10/2020    ECHOCARDIOGRAM REPORT   Patient Name:   Micheal Alvarez Date of Exam: 02/10/2020 Medical Rec #:  [id number]      Height:       65.0 in Accession #:    [id number]     Weight:       220.0 lb Date of Birth:  1961/07/13      BSA:          2.060 m Patient Age:    58 years       BP:           160/95 mmHg Patient Gender: M              HR:           80 bpm. Exam Location:  [facility] Procedure: 2D Echo Indications:    TIA  History:        Patient has no prior history of Echocardiogram examinations.                 TIA.  Sonographer:    Wonda Cerise Referring Phys: Wynona Neat NIU IMPRESSIONS  1. Left ventricular ejection fraction, by estimation, is 60 to 65%. The left ventricle has normal function. The left ventricle has no regional wall motion abnormalities. There is mild left ventricular hypertrophy. Left ventricular diastolic parameters were normal.  2. Right ventricular systolic function is normal. The right ventricular size is normal.  3. The mitral valve is normal in structure. Trivial mitral valve regurgitation. No evidence of mitral stenosis.  4. The aortic valve is tricuspid. Aortic valve regurgitation  is not visualized. No aortic stenosis is present.  5. The inferior vena cava is normal in size with greater than 50% respiratory variability, suggesting right atrial pressure of 3 mmHg. FINDINGS  Left Ventricle: Left ventricular ejection fraction, by estimation, is 60 to 65%. The left ventricle has normal function. The left ventricle has no regional wall motion abnormalities. The left ventricular internal cavity size was normal in size. There is  mild left ventricular hypertrophy. Left ventricular diastolic parameters were normal. Right Ventricle: The right ventricular size is normal. No increase in right ventricular wall thickness. Right ventricular systolic function is normal. Left Atrium: Left atrial size was normal in size. Right Atrium: Right atrial size was normal in size. Pericardium: There is no evidence of pericardial effusion. Mitral Valve: The mitral valve is normal in structure. There is mild thickening of the mitral valve leaflet(s). Normal mobility of the mitral valve leaflets. Trivial mitral valve regurgitation. No evidence of mitral valve stenosis. Tricuspid Valve: The tricuspid valve is normal in structure. Tricuspid valve regurgitation is mild . No evidence of tricuspid stenosis. Aortic Valve: The aortic valve is tricuspid. Aortic valve regurgitation is not visualized. No aortic stenosis is present. Aortic valve peak gradient measures 9.2 mmHg. Pulmonic Valve: The pulmonic valve was normal in structure. Pulmonic valve regurgitation is not visualized. No evidence of pulmonic stenosis. Aorta: The aortic root is normal in size and structure. Venous: The inferior vena cava is normal in size with greater than  50% respiratory variability, suggesting right atrial pressure of 3 mmHg. IAS/Shunts: No atrial level shunt detected by color flow Doppler.  LEFT VENTRICLE PLAX 2D LVIDd:         4.95 cm  Diastology LVIDs:         3.29 cm  LV e' lateral:   10.40 cm/s LV PW:         1.37 cm  LV E/e' lateral: 9.0 LV  IVS:        1.42 cm  LV e' medial:    8.27 cm/s LVOT diam:     2.00 cm  LV E/e' medial:  11.3 LV SV:         71 LV SV Index:   34 LVOT Area:     3.14 cm  RIGHT VENTRICLE RV Basal diam:  2.92 cm RV S prime:     17.00 cm/s TAPSE (M-mode): 2.1 cm LEFT ATRIUM             Index       RIGHT ATRIUM           Index LA diam:        3.20 cm 1.55 cm/m  RA Area:     16.50 cm LA Vol (A2C):   48.8 ml 23.69 ml/m RA Volume:   46.60 ml  22.62 ml/m LA Vol (A4C):   43.7 ml 21.21 ml/m LA Biplane Vol: 49.3 ml 23.93 ml/m  AORTIC VALVE                PULMONIC VALVE AV Area (Vmax): 2.19 cm    PV Vmax:       0.94 m/s AV Vmax:        152.00 cm/s PV Peak grad:  3.5 mmHg AV Peak Grad:   9.2 mmHg LVOT Vmax:      106.00 cm/s LVOT Vmean:     67.400 cm/s LVOT VTI:       0.225 m  AORTA Ao Root diam: 3.20 cm Ao Asc diam:  3.50 cm MITRAL VALVE               TRICUSPID VALVE MV Area (PHT): 3.85 cm    TV Peak grad:   22.7 mmHg MV Decel Time: 197 msec    TV Vmax:        2.38 m/s MV E velocity: 93.60 cm/s MV A velocity: 62.10 cm/s  SHUNTS MV E/A ratio:  1.51        Systemic VTI:  0.22 m                            Systemic Diam: 2.00 cm Charlton Haws MD Electronically signed by Charlton Haws MD Signature Date/Time: 02/10/2020/2:20:33 PM    Final     Subjective: Patient was feeling better when seen today.  He wants to go home.  He continued to have mild tingling of left hand fingers and on the left side of mouth.  Discharge Exam: Vitals:   02/10/20 0750 02/10/20 0900  BP:  (!) 165/115  Pulse:  (!) 55  Resp:  16  Temp:  98.2 F (36.8 C)  SpO2: 97% 98%   Vitals:   02/10/20 0547 02/10/20 0615 02/10/20 0750 02/10/20 0900  BP: (!) 167/125 (!) 193/112  (!) 165/115  Pulse: (!) 55 60  (!) 55  Resp: 18   16  Temp: 98.1 F (36.7 C)   98.2 F (36.8 C)  TempSrc:  Oral  SpO2: 98%  97% 98%  Weight:      Height:        General: Pt is alert, awake, not in acute distress Cardiovascular: RRR, S1/S2 +, no rubs, no gallops Respiratory:  CTA bilaterally, no wheezing, no rhonchi Abdominal: Soft, NT, ND, bowel sounds + Extremities: no edema, no cyanosis   The results of significant diagnostics from this hospitalization (including imaging, microbiology, ancillary and laboratory) are listed below for reference.    Microbiology: Recent Results (from the past 240 hour(s))  SARS CORONAVIRUS 2 (TAT 6-24 HRS) Nasopharyngeal Nasopharyngeal Swab     Status: None   Collection Time: 02/09/20  9:25 AM   Specimen: Nasopharyngeal Swab  Result Value Ref Range Status   SARS Coronavirus 2 NEGATIVE NEGATIVE Final    Comment: (NOTE) SARS-CoV-2 target nucleic acids are NOT DETECTED. The SARS-CoV-2 RNA is generally detectable in upper and lower respiratory specimens during the acute phase of infection. Negative results do not preclude SARS-CoV-2 infection, do not rule out co-infections with other pathogens, and should not be used as the sole basis for treatment or other patient management decisions. Negative results must be combined with clinical observations, patient history, and epidemiological information. The expected result is Negative. Fact Sheet for Patients: HairSlick.no Fact Sheet for Healthcare Providers: quierodirigir.com This test is not yet approved or cleared by the [place] FDA and  has been authorized for detection and/or diagnosis of SARS-CoV-2 by FDA under an Emergency Use Authorization (EUA). This EUA will remain  in effect (meaning this test can be used) for the duration of the COVID-19 declaration under Section 56 4(b)(1) of the Act, 21 U.S.C. section 360bbb-3(b)(1), unless the authorization is terminated or revoked sooner. Performed at [facility], [address] [place], [place] [address]      Labs: BNP (last 3 results) No results for input(s): BNP in the last 8760 hours. Basic Metabolic Panel: Recent Labs  Lab 02/09/20 0819 02/10/20 0552   NA 141 137  K 3.3* 3.1*  CL 104 104  CO2 27 26  GLUCOSE 117* 100*  BUN 15 12  CREATININE 1.37* 1.25*  CALCIUM 10.3 9.6  MG  --  2.0   Liver Function Tests: No results for input(s): AST, ALT, ALKPHOS, BILITOT, PROT, ALBUMIN in the last 168 hours. No results for input(s): LIPASE, AMYLASE in the last 168 hours. No results for input(s): AMMONIA in the last 168 hours. CBC: Recent Labs  Lab 02/09/20 0819  WBC 5.1  NEUTROABS 2.5  HGB 14.6  HCT 43.7  MCV 91.4  PLT 216   Cardiac Enzymes: No results for input(s): CKTOTAL, CKMB, CKMBINDEX, TROPONINI in the last 168 hours. BNP: Invalid input(s): POCBNP CBG: No results for input(s): GLUCAP in the last 168 hours. D-Dimer No results for input(s): DDIMER in the last 72 hours. Hgb A1c Recent Labs    02/10/20 0552  HGBA1C 5.5   Lipid Profile Recent Labs    02/10/20 0552  CHOL 202*  HDL 43  LDLCALC 124*  TRIG 176*  CHOLHDL 4.7   Thyroid function studies No results for input(s): TSH, T4TOTAL, T3FREE, THYROIDAB in the last 72 hours.  Invalid input(s): FREET3 Anemia work up No results for input(s): VITAMINB12, FOLATE, FERRITIN, TIBC, IRON, RETICCTPCT in the last 72 hours. Urinalysis    Component Value Date/Time   COLORURINE YELLOW (A) 08/22/2018 0945   APPEARANCEUR CLEAR (A) 08/22/2018 0945   APPEARANCEUR Cloudy 04/08/2014 0948   LABSPEC 1.021 08/22/2018 0945  LABSPEC 1.014 04/08/2014 0948   PHURINE 6.0 08/22/2018 0945   GLUCOSEU NEGATIVE 08/22/2018 0945   GLUCOSEU Negative 04/08/2014 0948   HGBUR NEGATIVE 08/22/2018 0945   BILIRUBINUR NEGATIVE 08/22/2018 0945   BILIRUBINUR Negative 04/08/2014 0948   KETONESUR NEGATIVE 08/22/2018 0945   PROTEINUR NEGATIVE 08/22/2018 0945   NITRITE NEGATIVE 08/22/2018 0945   LEUKOCYTESUR NEGATIVE 08/22/2018 0945   LEUKOCYTESUR Negative 04/08/2014 0948   Sepsis Labs Invalid input(s): PROCALCITONIN,  WBC,  LACTICIDVEN Microbiology Recent Results (from the past 240 hour(s))   SARS CORONAVIRUS 2 (TAT 6-24 HRS) Nasopharyngeal Nasopharyngeal Swab     Status: None   Collection Time: 02/09/20  9:25 AM   Specimen: Nasopharyngeal Swab  Result Value Ref Range Status   SARS Coronavirus 2 NEGATIVE NEGATIVE Final    Comment: (NOTE) SARS-CoV-2 target nucleic acids are NOT DETECTED. The SARS-CoV-2 RNA is generally detectable in upper and lower respiratory specimens during the acute phase of infection. Negative results do not preclude SARS-CoV-2 infection, do not rule out co-infections with other pathogens, and should not be used as the sole basis for treatment or other patient management decisions. Negative results must be combined with clinical observations, patient history, and epidemiological information. The expected result is Negative. Fact Sheet for Patients: HairSlick.nohttps://www.fda.gov/media/138098/download Fact Sheet for Healthcare Providers: quierodirigir.comhttps://www.fda.gov/media/138095/download This test is not yet approved or cleared by the [place] FDA and  has been authorized for detection and/or diagnosis of SARS-CoV-2 by FDA under an Emergency Use Authorization (EUA). This EUA will remain  in effect (meaning this test can be used) for the duration of the COVID-19 declaration under Section 56 4(b)(1) of the Act, 21 U.S.C. section 360bbb-3(b)(1), unless the authorization is terminated or revoked sooner. Performed at [facility], [address] [place], [place] [address]     Time coordinating discharge: Over 30 minutes  SIGNED:  Arnetha CourserSumayya Kynzley Dowson, MD  [facility] 02/10/2020, 3:05 PM  If 7PM-7AM, please contact night-coverage [email or web address]  This record has been created using Conservation officer, historic buildingsDragon voice recognition software. Errors have been sought and corrected,but may not always be located. Such creation errors do not reflect on the standard of care.
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
## 2020-02-10 NOTE — Progress Notes (Signed)
Bedside shift report received from [place], [place]. Pt. Sitting up in bed, denies any pain or needs at this time. Call light is within reach, will continue to monitor.

## 2020-02-10 NOTE — Progress Notes (Signed)
*  PRELIMINARY RESULTS* Echocardiogram 2D Echocardiogram has been performed.  Micheal Alvarez Micheal Alvarez 02/10/2020, 1:41 PM

## 2020-02-10 NOTE — Progress Notes (Signed)
OT Cancellation Note  Patient Details Name: Micheal Alvarez MRN: [id number] DOB: May 22, 1961   Cancelled Treatment:    Reason Eval/Treat Not Completed: OT screened, no needs identified, will sign off   OT consult received and chart reviewed. Upon observation of patient, he is noted to be Indep with mobility and ADLs. No acute OT needs Identified at this time. Will complete order and s/o at this time. Thank you.  Rejeana Brock, MS, OTR/L ascom [phone] 02/10/20, 12:11 PM

## 2020-03-11 ENCOUNTER — Other Ambulatory Visit: Payer: Self-pay | Admitting: Physician Assistant

## 2020-03-11 DIAGNOSIS — I639 Cerebral infarction, unspecified: Secondary | ICD-10-CM

## 2020-03-21 ENCOUNTER — Other Ambulatory Visit: Payer: Self-pay

## 2020-03-21 ENCOUNTER — Ambulatory Visit
Admission: RE | Admit: 2020-03-21 | Discharge: 2020-03-21 | Disposition: A | Discharge status: Home / Self Care | Payer: Medicaid Other | Source: Ambulatory Visit | Attending: Physician Assistant | Admitting: Physician Assistant

## 2020-03-21 DIAGNOSIS — I639 Cerebral infarction, unspecified: Secondary | ICD-10-CM | POA: Diagnosis present

## 2020-09-24 ENCOUNTER — Other Ambulatory Visit: Payer: Self-pay | Admitting: Physician Assistant

## 2020-09-24 DIAGNOSIS — R0600 Dyspnea, unspecified: Secondary | ICD-10-CM

## 2020-09-24 DIAGNOSIS — R0609 Other forms of dyspnea: Secondary | ICD-10-CM

## 2020-09-25 ENCOUNTER — Ambulatory Visit
Admission: RE | Admit: 2020-09-25 | Discharge: 2020-09-25 | Disposition: A | Discharge status: Home / Self Care | Payer: Medicaid Other | Source: Ambulatory Visit | Attending: Physician Assistant | Admitting: Physician Assistant

## 2020-09-25 ENCOUNTER — Ambulatory Visit
Admission: RE | Admit: 2020-09-25 | Discharge: 2020-09-25 | Disposition: A | Discharge status: Home / Self Care | Payer: Medicaid Other | Attending: Physician Assistant | Admitting: Physician Assistant

## 2020-09-25 ENCOUNTER — Other Ambulatory Visit: Payer: Self-pay

## 2020-09-25 DIAGNOSIS — R0600 Dyspnea, unspecified: Secondary | ICD-10-CM | POA: Insufficient documentation

## 2020-09-25 DIAGNOSIS — R0609 Other forms of dyspnea: Secondary | ICD-10-CM

## 2021-01-14 IMAGING — MR MR HEAD W/O CM
9 of 10 series · 30 of 48 positions shown · non-contrast
Comparison: Noncontrast head CT 02/09/2019

CLINICAL DATA: Stroke, follow-up stroke. Additional history
provided: Left-sided weakness and numbness in both left arm and left
leg, left facial numbness

EXAM:
MRI HEAD WITHOUT CONTRAST
MRA HEAD WITHOUT CONTRAST
TECHNIQUE: Multiplanar, multiecho pulse sequences of the brain and surrounding
structures were obtained without intravenous contrast. Angiographic
images of the head were obtained using MRA technique without
contrast.

[Series 3: ax dwi_adc · axial · 3.0mm · 1.31mm/px · z∈[-55,+99]mm · 5 of 48 slices shown]
[im 1/48]
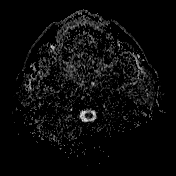
[im 12/48]
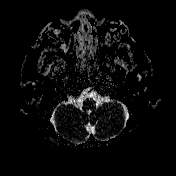
[im 24/48]
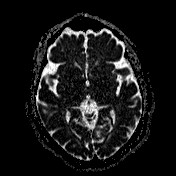
[im 36/48]
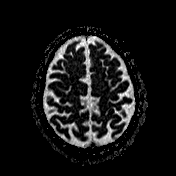
[im 48/48]
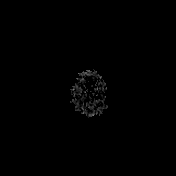

[Series 4: cor dwi_tracew · coronal · 5.0mm · 1.31mm/px · 8 of 84 slices shown]
[im 1/84]
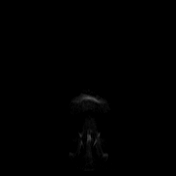
[im 12/84]
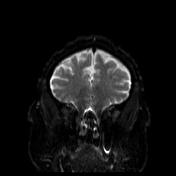
[im 24/84]
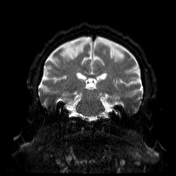
[im 36/84]
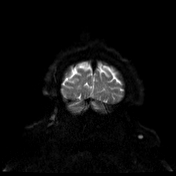
[im 48/84]
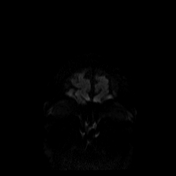
[im 60/84]
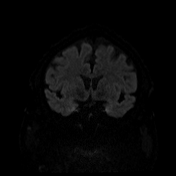
[im 72/84]
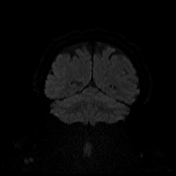
[im 84/84]
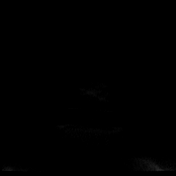

[Series 5: cor dwi_adc · coronal · 5.0mm · 1.31mm/px · 4 of 42 slices shown]
[im 1/42]
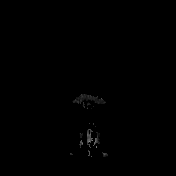
[im 14/42]
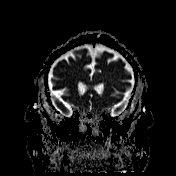
[im 28/42]
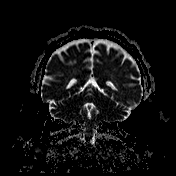
[im 42/42]
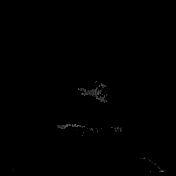

[Series 11: FLAIR · axial · 5.0mm · 1.20mm/px · z∈[-55,+100]mm · 2 of 27 slices shown]
[im 1/27]
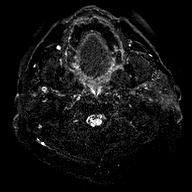
[im 27/27]
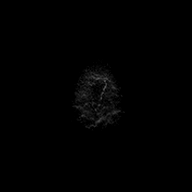

[Series 12: T2-star · axial · 5.0mm · 0.45mm/px · z∈[-56,+99]mm · 2 of 27 slices shown]
[im 1/27]
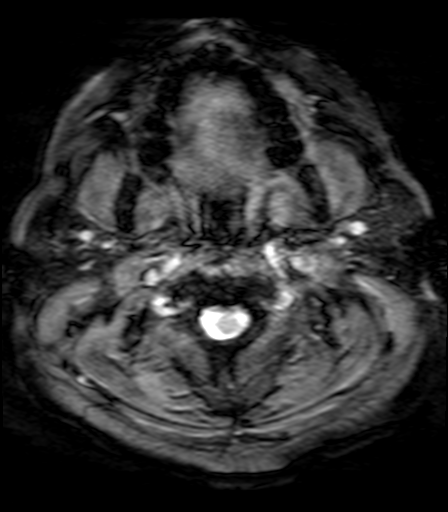
[im 27/27]
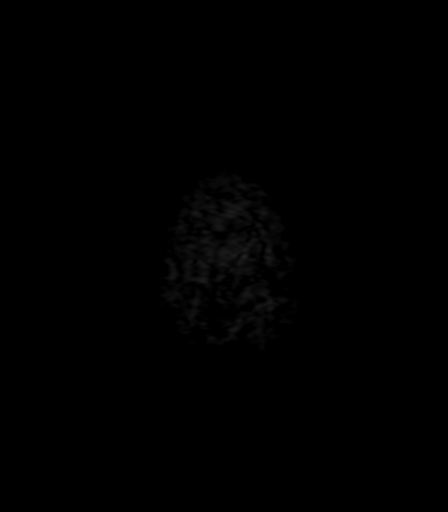

[Series 13: T2 · axial · 5.0mm · 0.45mm/px · z∈[-56,+99]mm · 2 of 27 slices shown (1 of 2)]
[im 1/27]
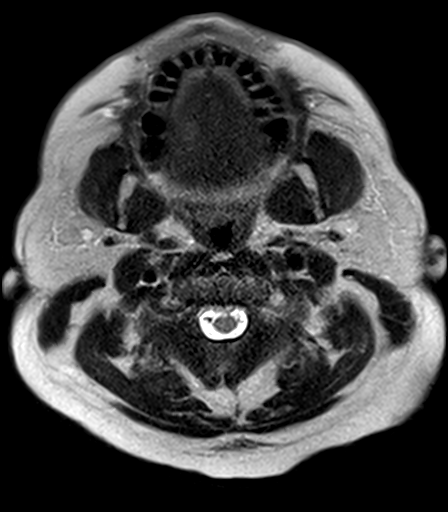
[im 27/27]
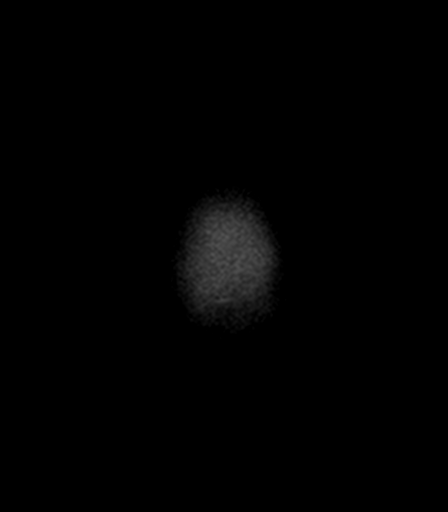

[Series 14: T1 · axial · 5.0mm · 0.90mm/px · z∈[-56,+99]mm · 2 of 27 slices shown (1 of 2)]
[im 1/27]
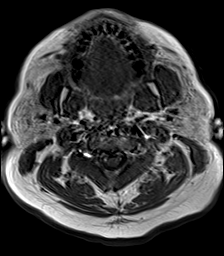
[im 27/27]
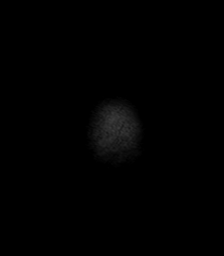

[Series 15: T1 · sagittal · 5.0mm · 0.94mm/px · 2 of 23 slices shown (2 of 2)]
[im 1/23]
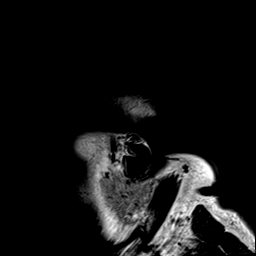
[im 23/23]
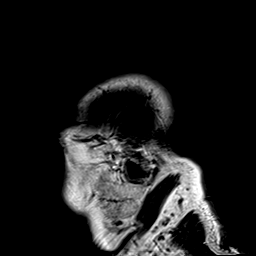

[Series 16: T2 · coronal · 5.0mm · 0.45mm/px · 3 of 33 slices shown (2 of 2)]
[im 1/33]
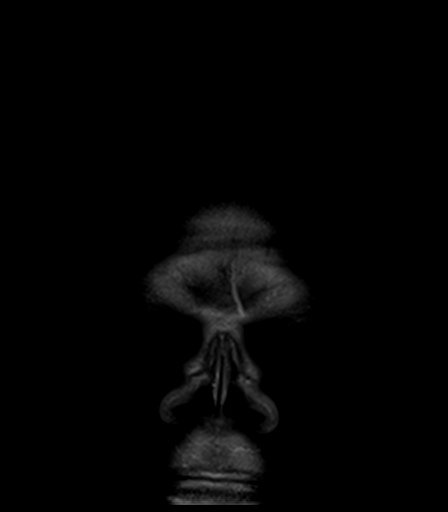
[im 17/33]
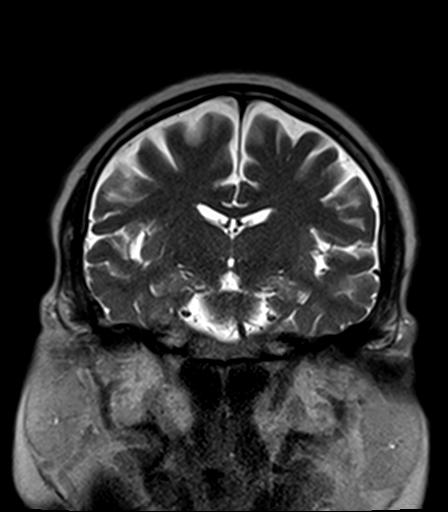
[im 33/33]
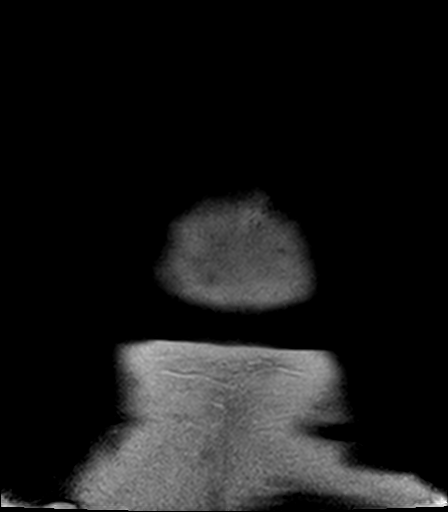

[30 of 48 positions shown; findings below may reference images not displayed]

FINDINGS: MRI HEAD FINDINGS

Brain:

The examination is intermittently motion degraded. Most notably
there is moderate motion degradation of the sagittal T1 weighted
sequence.

There is a 4 mm focus of restricted diffusion within the right
thalamocapsular junction consistent with acute infarct (series 2,
image 73) (series 4, image 64). Corresponding T2/FLAIR
hyperintensity at this site.

No evidence of intracranial mass.

No midline shift or extra-axial fluid collection.

No chronic intracranial blood products.

Additional mild scattered T2/FLAIR hyperintensity within the
cerebral white matter is nonspecific, but consistent with chronic
small vessel ischemic disease.

Cerebral volume is normal for age.

Vascular: Reported below.

Skull and upper cervical spine: Within limitations of motion
degradation, no focal marrow lesion is identified. Incompletely
assessed upper cervical spondylosis.

Sinuses/Orbits: Visualized orbits demonstrate no acute abnormality.
Mild ethmoid and maxillary sinus mucosal thickening. No significant
mastoid effusion.

MRA HEAD FINDINGS

The visualized distal cervical and intracranial right internal
carotid artery is asymmetrically diminutive, which may reflect a
significant stenosis more proximally within the neck. The
intracranial right ICA is patent. However, there is marked
atherosclerotic irregularity of the cavernous and paraclinoid
segments with severe stenosis.

The intracranial left internal carotid artery is patent without
significant stenosis.

The M1 middle cerebral arteries are patent without significant
stenosis. No proximal M2 branch occlusion is identified.
Atherosclerotic irregularity of the M2 and more distal MCA branch
vessels bilaterally. Most notably there are sites of moderate/severe
stenosis within multiple bilateral proximal M2 branches.

The anterior cerebral arteries are patent bilaterally. The A1 right
ACA is hypoplastic. There is a moderate/severe focal stenosis within
the A2 left ACA (series 6, image 143) (series 3428, image 19).

No intracranial aneurysm is identified.

The intracranial vertebral arteries are patent bilaterally. The
right vertebral artery is dominant. There is moderate
atherosclerotic narrowing within the distal V4 left vertebral artery
just proximal to the vertebrobasilar junction. The basilar artery is
patent without significant stenosis. The posterior cerebral arteries
are patent without significant proximal stenosis. Posterior
communicating arteries are present bilaterally.
IMPRESSION: MRI brain:

1. Intermittently motion degraded examination.
2. 4 mm acute infarct within the right thalamocapsular junction.
3. Background mild chronic small vessel ischemic disease within the
cerebral white matter.
4. Mild ethmoid and maxillary sinus mucosal thickening.

MRA head:

1. The visualized distal cervical and intracranial right ICA is
asymmetrically diminutive, which may reflect the presence of
significant stenosis more proximally within the neck.
2. Marked atherosclerotic irregularity of the cavernous and
paraclinoid right ICA with severe stenosis.
3. Atherosclerotic irregularity of the M2 and more distal MCA branch
vessels bilaterally. Most notably, there are moderate/severe
stenoses within multiple bilateral proximal M2 branches.
4. Moderate/severe focal stenosis within the A2 left anterior
cerebral artery.
5. Moderate focal stenosis within the distal V4 left vertebral
artery.

## 2022-05-30 ENCOUNTER — Emergency Department
Admission: EM | Admit: 2022-05-30 | Discharge: 2022-05-30 | Disposition: A | Discharge status: Home / Self Care | Payer: Medicaid Other | Attending: Emergency Medicine | Admitting: Emergency Medicine

## 2022-05-30 ENCOUNTER — Other Ambulatory Visit: Payer: Self-pay

## 2022-05-30 ENCOUNTER — Encounter: Payer: Self-pay | Admitting: Emergency Medicine

## 2022-05-30 DIAGNOSIS — K0889 Other specified disorders of teeth and supporting structures: Secondary | ICD-10-CM | POA: Diagnosis present

## 2022-05-30 DIAGNOSIS — I10 Essential (primary) hypertension: Secondary | ICD-10-CM | POA: Insufficient documentation

## 2022-05-30 DIAGNOSIS — K047 Periapical abscess without sinus: Secondary | ICD-10-CM | POA: Insufficient documentation

## 2022-05-30 MED ORDER — AMOXICILLIN 500 MG PO CAPS: 500.0000 mg | ORAL_CAPSULE | Freq: Three times a day (TID) | ORAL | 0 refills | Status: DC

## 2022-05-30 MED ORDER — KETOROLAC TROMETHAMINE 30 MG/ML IJ SOLN
30.0000 mg | Freq: Once | INTRAMUSCULAR | Status: AC
  Administered 2022-05-30: 30 mg via INTRAMUSCULAR
  Filled 2022-05-30: qty 1

## 2022-05-30 MED ORDER — IBUPROFEN 600 MG PO TABS: 600.0000 mg | ORAL_TABLET | Freq: Three times a day (TID) | ORAL | 0 refills | Status: DC | PRN

## 2022-05-30 NOTE — ED Triage Notes (Signed)
Pt reports toothache to front upper teeth for the past 2 days.

## 2022-05-30 NOTE — Discharge Instructions (Signed)
You were seen today for dental pain.  You have an infection where your teeth are broken off at the gumline.  I am putting you on antibiotics 3 times daily for the next 10 days.  I am also putting you on anti-inflammatories every 8 hours as needed.  You may gargle with salt water as needed for comfort.  Please follow-up with your dentist on Monday as previously scheduled.

## 2022-05-30 NOTE — ED Provider Notes (Signed)
[facility] Provider Note    Event Date/Time   First MD Initiated Contact with Patient 05/30/22 1327     (approximate)   History   Dental Pain   HPI  Micheal Alvarez is a 61 y.o. male with a history of HTN, HLD status post right, GERD presents to the ER today with complaint of dental pain and gum swelling.  He reports this started 2 days ago.  He reports 2 broken teeth of the upper gumline.  He reports the area is throbbing.  He has not noticed any drainage but reports he can taste infection.  He denies facial swelling.  He denies fever, chills, nausea or vomiting.  He does have an appointment with a dentist on Monday.      Physical Exam   Triage Vital Signs: ED Triage Vitals  Enc Vitals Group     BP 05/30/22 1323 (!) 145/74     Pulse Rate 05/30/22 1323 84     Resp 05/30/22 1323 18     Temp 05/30/22 1323 98.4 F (36.9 C)     Temp Source 05/30/22 1323 Oral     SpO2 05/30/22 1323 98 %     Weight 05/30/22 1244 218 lb 4.1 oz (99 kg)     Height 05/30/22 1244 5\' 5"  (1.651 m)     Head Circumference --      Peak Flow --      Pain Score 05/30/22 1244 10     Pain Loc --      Pain Edu? --      Excl. in GC? --     Most recent vital signs: Vitals:   05/30/22 1323  BP: (!) 145/74  Pulse: 84  Resp: 18  Temp: 98.4 F (36.9 C)  SpO2: 98%   General: Awake, appears uncomfortable but in NAD. ENT:  Teeth #8/9 broken off at the gumline with obvious decay.  Swelling noted at the gumline at the area of the broken teeth. CV:  RRR. Resp:  Normal effort.     ED Results / Procedures / Treatments      MEDICATIONS ORDERED IN ED: Medications  ketorolac (TORADOL) 30 MG/ML injection 30 mg (has no administration in time range)     IMPRESSION / MDM / ASSESSMENT AND PLAN / ED COURSE  I reviewed the triage vital signs and the nursing notes.   Differential diagnosis includes, but is not limited to, fractured tooth with abscess, fractured tooth without  abscess  Patient's presentation is most consistent with acute, uncomplicated illness.  No evidence of facial cellulitis based on exam Toradol 30 mg IM x1 for pain and inflammation Rx for Amoxicillin 500 mg 3 times daily for 10 days Rx for Ibuprofen 600 mg every 8 hours as needed Encouraged him to gargle with salt water  He will follow-up with the dentist on Monday as planned    FINAL CLINICAL IMPRESSION(S) / ED DIAGNOSES   Final diagnoses:  Dental abscess     Rx / DC Orders   ED Discharge Orders          Ordered    amoxicillin (AMOXIL) 500 MG capsule  3 times daily        05/30/22 1351    ibuprofen (ADVIL) 600 MG tablet  Every 8 hours PRN        05/30/22 1351             Note:  This document was prepared using Dragon voice recognition software  and may include unintentional dictation errors.    Lorre Munroe, NP 05/30/22 1352    Merwyn Katos, MD 05/30/22 [phone]

## 2022-11-21 ENCOUNTER — Other Ambulatory Visit: Payer: Self-pay | Admitting: Internal Medicine

## 2022-11-26 ENCOUNTER — Ambulatory Visit: Payer: Medicaid Other | Admitting: *Deleted

## 2022-12-01 ENCOUNTER — Encounter: Payer: Medicare Other | Admitting: Internal Medicine

## 2022-12-03 ENCOUNTER — Telehealth: Payer: Self-pay

## 2022-12-03 ENCOUNTER — Other Ambulatory Visit: Payer: Medicare Other

## 2022-12-03 NOTE — Telephone Encounter (Signed)
Patient came by office and states that his Insurance will not cover the combo BP pill, olmesatan- Amlodipine- HCTZ can you send them in separately or change to something else

## 2022-12-08 NOTE — Telephone Encounter (Signed)
LM for pt to call back.

## 2022-12-11 NOTE — Telephone Encounter (Signed)
Since we have been unsuccessful in getting a call back from patient we can discuss this at his visit next week

## 2022-12-16 ENCOUNTER — Ambulatory Visit (INDEPENDENT_AMBULATORY_CARE_PROVIDER_SITE_OTHER): Payer: Medicare Other | Admitting: Internal Medicine

## 2022-12-16 VITALS — BP 150/110 | HR 77 | Ht 65.0 in | Wt 225.8 lb

## 2022-12-16 DIAGNOSIS — K219 Gastro-esophageal reflux disease without esophagitis: Secondary | ICD-10-CM | POA: Diagnosis not present

## 2022-12-16 DIAGNOSIS — E119 Type 2 diabetes mellitus without complications: Secondary | ICD-10-CM | POA: Diagnosis not present

## 2022-12-16 DIAGNOSIS — Z0001 Encounter for general adult medical examination with abnormal findings: Secondary | ICD-10-CM | POA: Diagnosis not present

## 2022-12-16 DIAGNOSIS — I10 Essential (primary) hypertension: Secondary | ICD-10-CM

## 2022-12-16 DIAGNOSIS — Z1331 Encounter for screening for depression: Secondary | ICD-10-CM | POA: Diagnosis not present

## 2022-12-16 DIAGNOSIS — E782 Mixed hyperlipidemia: Secondary | ICD-10-CM

## 2022-12-16 LAB — POCT CBG (FASTING - GLUCOSE)-MANUAL ENTRY: Glucose Fasting, POC: 94 mg/dL (ref 70–99)

## 2022-12-16 MED ORDER — HYDRALAZINE HCL 100 MG PO TABS: 100.0000 mg | ORAL_TABLET | Freq: Three times a day (TID) | ORAL | 0 refills | Status: DC

## 2022-12-16 MED ORDER — GLUCOSE BLOOD VI STRP: ORAL_STRIP | 3 refills | Status: DC
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
## 2023-01-06 ENCOUNTER — Ambulatory Visit: Payer: Medicare Other | Admitting: Internal Medicine

## 2023-01-07 ENCOUNTER — Other Ambulatory Visit: Payer: Medicare Other

## 2023-01-08 LAB — LIPID PANEL
Chol/HDL Ratio: 3.5 ratio (ref 0.0–5.0)
Cholesterol, Total: 126 mg/dL (ref 100–199)
HDL: 36 mg/dL — ABNORMAL LOW (ref >39)
LDL Chol Calc (NIH): 68 mg/dL (ref 0–99)
Triglycerides: 122 mg/dL (ref 0–149)
VLDL Cholesterol Cal: 22 mg/dL (ref 5–40)

## 2023-01-08 LAB — HEPATIC FUNCTION PANEL
ALT: 21 IU/L (ref 0–44)
AST: 23 IU/L (ref 0–40)
Albumin: 4.4 g/dL (ref 3.9–4.9)
Alkaline Phosphatase: 103 IU/L (ref 44–121)
Bilirubin Total: 0.9 mg/dL (ref 0.0–1.2)
Bilirubin, Direct: 0.24 mg/dL (ref 0.00–0.40)
Total Protein: 7.3 g/dL (ref 6.0–8.5)

## 2023-01-08 LAB — HEMOGLOBIN A1C
Est. average glucose Bld gHb Est-mCnc: 108 mg/dL
Hgb A1c MFr Bld: 5.4 % (ref 4.8–5.6)

## 2023-01-12 ENCOUNTER — Encounter: Payer: Self-pay | Admitting: Internal Medicine

## 2023-01-12 ENCOUNTER — Ambulatory Visit (INDEPENDENT_AMBULATORY_CARE_PROVIDER_SITE_OTHER): Payer: Medicare Other | Admitting: Internal Medicine

## 2023-01-12 VITALS — BP 115/70 | HR 103 | Temp 97.7°F | Ht 65.0 in | Wt 219.2 lb

## 2023-01-12 DIAGNOSIS — I10 Essential (primary) hypertension: Secondary | ICD-10-CM | POA: Diagnosis not present

## 2023-01-12 DIAGNOSIS — E119 Type 2 diabetes mellitus without complications: Secondary | ICD-10-CM

## 2023-01-12 LAB — GLUCOSE, POCT (MANUAL RESULT ENTRY): POC Glucose: 111 mg/dl — AB (ref 70–99)

## 2023-01-12 MED ORDER — ACCU-CHEK FASTCLIX LANCET KIT: 1.0000 | PACK | Freq: Two times a day (BID) | 0 refills | Status: AC

## 2023-01-12 MED ORDER — METFORMIN HCL ER 750 MG PO TB24: 750.0000 mg | ORAL_TABLET | Freq: Two times a day (BID) | ORAL | 0 refills | Status: DC

## 2023-01-12 MED ORDER — ACCU-CHEK FASTCLIX LANCETS MISC: 1.0000 [IU] | Freq: Two times a day (BID) | 3 refills | Status: AC

## 2023-01-12 MED ORDER — ACCU-CHEK GUIDE W/DEVICE KIT: 1.0000 | PACK | Freq: Once | 0 refills | Status: AC
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
## 2023-02-26 ENCOUNTER — Other Ambulatory Visit: Payer: Medicare HMO

## 2023-02-26 DIAGNOSIS — E119 Type 2 diabetes mellitus without complications: Secondary | ICD-10-CM

## 2023-02-26 DIAGNOSIS — I10 Essential (primary) hypertension: Secondary | ICD-10-CM

## 2023-03-02 ENCOUNTER — Ambulatory Visit: Payer: Medicare HMO | Admitting: Internal Medicine

## 2023-03-04 ENCOUNTER — Other Ambulatory Visit: Payer: Self-pay

## 2023-03-04 DIAGNOSIS — E119 Type 2 diabetes mellitus without complications: Secondary | ICD-10-CM

## 2023-03-05 MED ORDER — METFORMIN HCL ER 750 MG PO TB24: 750.0000 mg | ORAL_TABLET | Freq: Two times a day (BID) | ORAL | 0 refills | Status: DC

## 2023-03-05 MED ORDER — AMLODIPINE BESYLATE 10 MG PO TABS: 10.0000 mg | ORAL_TABLET | Freq: Every day | ORAL | 1 refills | Status: DC

## 2023-03-05 MED ORDER — ATORVASTATIN CALCIUM 40 MG PO TABS: 40.0000 mg | ORAL_TABLET | Freq: Every day | ORAL | 0 refills | Status: DC

## 2023-03-05 MED ORDER — HYDROCHLOROTHIAZIDE 25 MG PO TABS: 25.0000 mg | ORAL_TABLET | Freq: Every day | ORAL | 1 refills | Status: DC

## 2023-03-05 MED ORDER — FLUTICASONE PROPIONATE 50 MCG/ACT NA SUSP
1.0000 | Freq: Every day | NASAL | 3 refills | Status: DC
Start: 2023-03-05 — End: 2024-01-27

## 2023-03-08 ENCOUNTER — Other Ambulatory Visit: Payer: Medicare HMO

## 2023-03-10 ENCOUNTER — Encounter: Payer: Self-pay | Admitting: Internal Medicine

## 2023-03-10 ENCOUNTER — Ambulatory Visit (INDEPENDENT_AMBULATORY_CARE_PROVIDER_SITE_OTHER): Payer: Medicare HMO | Admitting: Internal Medicine

## 2023-03-10 VITALS — BP 140/90 | HR 67 | Ht 65.0 in | Wt 206.8 lb

## 2023-03-10 DIAGNOSIS — S43402A Unspecified sprain of left shoulder joint, initial encounter: Secondary | ICD-10-CM | POA: Diagnosis not present

## 2023-03-10 DIAGNOSIS — I10 Essential (primary) hypertension: Secondary | ICD-10-CM

## 2023-03-10 DIAGNOSIS — E782 Mixed hyperlipidemia: Secondary | ICD-10-CM

## 2023-03-10 DIAGNOSIS — E119 Type 2 diabetes mellitus without complications: Secondary | ICD-10-CM | POA: Diagnosis not present

## 2023-03-10 LAB — HEPATIC FUNCTION PANEL
ALT: 20 IU/L (ref 0–44)
AST: 22 IU/L (ref 0–40)
Albumin: 4.4 g/dL (ref 3.9–4.9)
Alkaline Phosphatase: 86 IU/L (ref 44–121)
Bilirubin Total: 0.6 mg/dL (ref 0.0–1.2)
Bilirubin, Direct: 0.16 mg/dL (ref 0.00–0.40)
Total Protein: 7.1 g/dL (ref 6.0–8.5)

## 2023-03-10 LAB — LIPID PANEL
Chol/HDL Ratio: 2.8 ratio (ref 0.0–5.0)
Cholesterol, Total: 149 mg/dL (ref 100–199)
HDL: 54 mg/dL (ref >39)
LDL Chol Calc (NIH): 80 mg/dL (ref 0–99)
Triglycerides: 79 mg/dL (ref 0–149)
VLDL Cholesterol Cal: 15 mg/dL (ref 5–40)

## 2023-03-10 LAB — POC CREATINE & ALBUMIN,URINE
Creatinine, POC: 100 mg/dL
Microalbumin Ur, POC: 30 mg/L

## 2023-03-10 LAB — POCT CBG (FASTING - GLUCOSE)-MANUAL ENTRY: Glucose Fasting, POC: 106 mg/dL — AB (ref 70–99)

## 2023-03-10 MED ORDER — ACCU-CHEK AVIVA PLUS W/DEVICE KIT: 1.0000 | PACK | Freq: Once | 0 refills | Status: AC

## 2023-03-10 MED ORDER — ACETAMINOPHEN 500 MG PO TABS: 1000.0000 mg | ORAL_TABLET | Freq: Four times a day (QID) | ORAL | 2 refills | Status: AC | PRN

## 2023-03-10 MED ORDER — HYDROCHLOROTHIAZIDE 25 MG PO TABS: 25.0000 mg | ORAL_TABLET | Freq: Every day | ORAL | 1 refills | Status: DC

## 2023-03-10 MED ORDER — AMLODIPINE BESYLATE 10 MG PO TABS: 10.0000 mg | ORAL_TABLET | Freq: Every day | ORAL | 1 refills | Status: DC

## 2023-03-10 MED ORDER — DICLOFENAC SODIUM 1 % EX GEL: 2.0000 g | Freq: Three times a day (TID) | CUTANEOUS | 1 refills | Status: DC

## 2023-03-10 MED ORDER — OLMESARTAN MEDOXOMIL 40 MG PO TABS: 40.0000 mg | ORAL_TABLET | Freq: Every day | ORAL | 0 refills | Status: DC
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
## 2023-03-11 LAB — HEMOGLOBIN A1C
Est. average glucose Bld gHb Est-mCnc: 100 mg/dL
Hgb A1c MFr Bld: 5.1 % (ref 4.8–5.6)

## 2023-03-19 ENCOUNTER — Encounter: Payer: Self-pay | Admitting: Internal Medicine

## 2023-03-19 ENCOUNTER — Ambulatory Visit (INDEPENDENT_AMBULATORY_CARE_PROVIDER_SITE_OTHER): Payer: Medicare HMO | Admitting: Internal Medicine

## 2023-03-19 VITALS — BP 122/76 | HR 75 | Ht 65.0 in | Wt 202.0 lb

## 2023-03-19 DIAGNOSIS — I10 Essential (primary) hypertension: Secondary | ICD-10-CM

## 2023-03-19 DIAGNOSIS — S83412A Sprain of medial collateral ligament of left knee, initial encounter: Secondary | ICD-10-CM | POA: Diagnosis not present

## 2023-03-19 DIAGNOSIS — S43402D Unspecified sprain of left shoulder joint, subsequent encounter: Secondary | ICD-10-CM | POA: Diagnosis not present

## 2023-03-19 DIAGNOSIS — M25562 Pain in left knee: Secondary | ICD-10-CM | POA: Diagnosis not present

## 2023-03-19 MED ORDER — KNEE BRACE ADJUSTABLE HINGED MISC: 1.0000 | Freq: Every day | 0 refills | Status: AC

## 2023-03-19 MED ORDER — MELOXICAM 15 MG PO TABS: 15.0000 mg | ORAL_TABLET | Freq: Every day | ORAL | 1 refills | Status: AC
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
## 2023-03-22 ENCOUNTER — Ambulatory Visit (INDEPENDENT_AMBULATORY_CARE_PROVIDER_SITE_OTHER): Payer: Medicare HMO

## 2023-03-22 DIAGNOSIS — M25562 Pain in left knee: Secondary | ICD-10-CM

## 2023-04-06 ENCOUNTER — Ambulatory Visit: Payer: Medicare HMO | Admitting: Internal Medicine

## 2023-04-10 ENCOUNTER — Other Ambulatory Visit: Payer: Self-pay | Admitting: Internal Medicine

## 2023-04-10 DIAGNOSIS — I10 Essential (primary) hypertension: Secondary | ICD-10-CM

## 2023-04-14 ENCOUNTER — Other Ambulatory Visit: Payer: Self-pay | Admitting: Internal Medicine

## 2023-04-14 ENCOUNTER — Telehealth: Payer: Self-pay

## 2023-04-14 DIAGNOSIS — I10 Essential (primary) hypertension: Secondary | ICD-10-CM

## 2023-04-14 MED ORDER — HYDRALAZINE HCL 100 MG PO TABS: 100.0000 mg | ORAL_TABLET | Freq: Three times a day (TID) | ORAL | 0 refills | Status: DC

## 2023-04-14 NOTE — Telephone Encounter (Signed)
Patient is requesting to get hydralazine 50mg  sent to [facility] in [place]

## 2023-04-20 ENCOUNTER — Ambulatory Visit (INDEPENDENT_AMBULATORY_CARE_PROVIDER_SITE_OTHER): Payer: Medicare HMO | Admitting: Internal Medicine

## 2023-04-20 VITALS — BP 119/69 | HR 90 | Ht 65.0 in | Wt 200.0 lb

## 2023-04-20 DIAGNOSIS — S83412A Sprain of medial collateral ligament of left knee, initial encounter: Secondary | ICD-10-CM | POA: Diagnosis not present

## 2023-04-20 DIAGNOSIS — E782 Mixed hyperlipidemia: Secondary | ICD-10-CM

## 2023-04-20 DIAGNOSIS — E119 Type 2 diabetes mellitus without complications: Secondary | ICD-10-CM

## 2023-04-20 LAB — POCT CBG (FASTING - GLUCOSE)-MANUAL ENTRY: Glucose Fasting, POC: 119 mg/dL — AB (ref 70–99)

## 2023-04-20 MED ORDER — TRAMADOL-ACETAMINOPHEN 37.5-325 MG PO TABS: 1.0000 | ORAL_TABLET | Freq: Four times a day (QID) | ORAL | 0 refills | Status: AC | PRN
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
## 2023-05-17 ENCOUNTER — Other Ambulatory Visit: Payer: Self-pay | Admitting: Internal Medicine

## 2023-05-17 DIAGNOSIS — I10 Essential (primary) hypertension: Secondary | ICD-10-CM

## 2023-05-18 ENCOUNTER — Telehealth: Payer: Self-pay | Admitting: Internal Medicine

## 2023-05-18 NOTE — Telephone Encounter (Signed)
Britta Mccreedy called to see if referral had been sent for patient for his knee. Advised her the referral was sent to [facility], phone number was also given to Britta Mccreedy so she can reach out to them about scheduling.

## 2023-05-23 ENCOUNTER — Other Ambulatory Visit: Payer: Self-pay

## 2023-05-23 ENCOUNTER — Emergency Department
Admission: EM | Admit: 2023-05-23 | Discharge: 2023-05-23 | Disposition: A | Discharge status: Home / Self Care | Payer: Medicare HMO | Source: Home / Self Care | Attending: Student in an Organized Health Care Education/Training Program | Admitting: Student in an Organized Health Care Education/Training Program

## 2023-05-23 ENCOUNTER — Emergency Department: Payer: Medicare HMO

## 2023-05-23 DIAGNOSIS — J45909 Unspecified asthma, uncomplicated: Secondary | ICD-10-CM | POA: Diagnosis not present

## 2023-05-23 DIAGNOSIS — R519 Headache, unspecified: Secondary | ICD-10-CM | POA: Insufficient documentation

## 2023-05-23 DIAGNOSIS — I10 Essential (primary) hypertension: Secondary | ICD-10-CM | POA: Diagnosis not present

## 2023-05-23 DIAGNOSIS — M25561 Pain in right knee: Secondary | ICD-10-CM | POA: Diagnosis not present

## 2023-05-23 DIAGNOSIS — Y92410 Unspecified street and highway as the place of occurrence of the external cause: Secondary | ICD-10-CM | POA: Insufficient documentation

## 2023-05-23 DIAGNOSIS — M542 Cervicalgia: Secondary | ICD-10-CM | POA: Insufficient documentation

## 2023-05-23 MED ORDER — LIDOCAINE 5 % EX PTCH
1.0000 | MEDICATED_PATCH | CUTANEOUS | Status: DC
  Administered 2023-05-23: 1 via TRANSDERMAL
  Filled 2023-05-23: qty 1

## 2023-05-23 MED ORDER — IBUPROFEN 400 MG PO TABS
400.0000 mg | ORAL_TABLET | Freq: Once | ORAL | Status: AC | PRN
  Administered 2023-05-23: 400 mg via ORAL
  Filled 2023-05-23: qty 1

## 2023-05-23 MED ORDER — ACETAMINOPHEN 325 MG PO TABS
650.0000 mg | ORAL_TABLET | Freq: Once | ORAL | Status: AC
  Administered 2023-05-23: 650 mg via ORAL
  Filled 2023-05-23: qty 2
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
## 2023-05-23 NOTE — Discharge Instructions (Signed)
Your CT head, neck, and x-ray of your knee were normal.  You may take Tylenol/ibuprofen per package instructions to help with your symptoms.  Please return for any new, worsening, or change in symptoms or other concerns.  It was a pleasure caring for you today.

## 2023-05-26 ENCOUNTER — Telehealth: Payer: Self-pay

## 2023-05-28 NOTE — Telephone Encounter (Signed)
Spoke with pt who was advised to be seen by the ED again at this time.

## 2023-05-31 ENCOUNTER — Other Ambulatory Visit: Payer: Self-pay

## 2023-05-31 ENCOUNTER — Emergency Department
Admission: EM | Admit: 2023-05-31 | Discharge: 2023-05-31 | Disposition: A | Discharge status: Home / Self Care | Payer: Medicare HMO | Attending: Emergency Medicine | Admitting: Emergency Medicine

## 2023-05-31 DIAGNOSIS — Y92410 Unspecified street and highway as the place of occurrence of the external cause: Secondary | ICD-10-CM | POA: Diagnosis not present

## 2023-05-31 DIAGNOSIS — S8001XD Contusion of right knee, subsequent encounter: Secondary | ICD-10-CM

## 2023-05-31 DIAGNOSIS — S161XXA Strain of muscle, fascia and tendon at neck level, initial encounter: Secondary | ICD-10-CM | POA: Diagnosis not present

## 2023-05-31 DIAGNOSIS — S8001XA Contusion of right knee, initial encounter: Secondary | ICD-10-CM | POA: Insufficient documentation

## 2023-05-31 DIAGNOSIS — R519 Headache, unspecified: Secondary | ICD-10-CM | POA: Diagnosis not present

## 2023-05-31 DIAGNOSIS — M542 Cervicalgia: Secondary | ICD-10-CM | POA: Diagnosis present

## 2023-05-31 HISTORY — DX: Type 2 diabetes mellitus without complications: E11.9

## 2023-05-31 MED ORDER — TRAMADOL HCL 50 MG PO TABS: 50.0000 mg | ORAL_TABLET | Freq: Four times a day (QID) | ORAL | 0 refills | Status: AC | PRN
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
## 2023-06-11 ENCOUNTER — Ambulatory Visit: Payer: Medicare HMO | Admitting: Internal Medicine

## 2023-06-14 ENCOUNTER — Other Ambulatory Visit: Payer: Medicare HMO

## 2023-06-14 DIAGNOSIS — E119 Type 2 diabetes mellitus without complications: Secondary | ICD-10-CM

## 2023-06-14 DIAGNOSIS — E782 Mixed hyperlipidemia: Secondary | ICD-10-CM

## 2023-06-15 LAB — HEMOGLOBIN A1C
Est. average glucose Bld gHb Est-mCnc: 94 mg/dL
Hgb A1c MFr Bld: 4.9 % (ref 4.8–5.6)

## 2023-06-15 LAB — CK: Total CK: 125 U/L (ref 41–331)

## 2023-06-15 LAB — LIPID PANEL
Chol/HDL Ratio: 3.3 ratio (ref 0.0–5.0)
Cholesterol, Total: 169 mg/dL (ref 100–199)
HDL: 52 mg/dL (ref >39)
LDL Chol Calc (NIH): 88 mg/dL (ref 0–99)
Triglycerides: 169 mg/dL — ABNORMAL HIGH (ref 0–149)
VLDL Cholesterol Cal: 29 mg/dL (ref 5–40)

## 2023-06-16 ENCOUNTER — Other Ambulatory Visit: Payer: Self-pay

## 2023-06-16 DIAGNOSIS — I10 Essential (primary) hypertension: Secondary | ICD-10-CM

## 2023-06-16 MED ORDER — HYDRALAZINE HCL 100 MG PO TABS
100.0000 mg | ORAL_TABLET | Freq: Three times a day (TID) | ORAL | 0 refills | Status: DC
Start: 2023-06-16 — End: 2023-06-22

## 2023-06-18 ENCOUNTER — Other Ambulatory Visit: Payer: Self-pay

## 2023-06-18 DIAGNOSIS — I10 Essential (primary) hypertension: Secondary | ICD-10-CM

## 2023-06-22 ENCOUNTER — Other Ambulatory Visit: Payer: Self-pay

## 2023-06-22 ENCOUNTER — Telehealth: Payer: Self-pay | Admitting: Internal Medicine

## 2023-06-22 DIAGNOSIS — I10 Essential (primary) hypertension: Secondary | ICD-10-CM

## 2023-06-22 MED ORDER — HYDRALAZINE HCL 100 MG PO TABS
100.0000 mg | ORAL_TABLET | Freq: Three times a day (TID) | ORAL | 0 refills | Status: AC
Start: 2023-06-22 — End: ?

## 2023-06-22 NOTE — Telephone Encounter (Signed)
Rx for hydralazine 100 mg was sent to [facility]. Needs to go to [facility] in [place]. Patient has been without the medicine for a week. Please send.

## 2023-06-25 ENCOUNTER — Ambulatory Visit (INDEPENDENT_AMBULATORY_CARE_PROVIDER_SITE_OTHER): Payer: Medicare HMO | Admitting: Internal Medicine

## 2023-06-25 VITALS — BP 140/90 | HR 78 | Ht 65.0 in | Wt 201.2 lb

## 2023-06-25 DIAGNOSIS — I10 Essential (primary) hypertension: Secondary | ICD-10-CM | POA: Diagnosis not present

## 2023-06-25 DIAGNOSIS — E119 Type 2 diabetes mellitus without complications: Secondary | ICD-10-CM | POA: Diagnosis not present

## 2023-06-25 DIAGNOSIS — E782 Mixed hyperlipidemia: Secondary | ICD-10-CM | POA: Diagnosis not present

## 2023-06-25 DIAGNOSIS — S83412A Sprain of medial collateral ligament of left knee, initial encounter: Secondary | ICD-10-CM | POA: Insufficient documentation

## 2023-06-25 LAB — POCT CBG (FASTING - GLUCOSE)-MANUAL ENTRY: Glucose Fasting, POC: 87 mg/dL (ref 70–99)

## 2023-06-25 MED ORDER — OLMESARTAN MEDOXOMIL 40 MG PO TABS
40.0000 mg | ORAL_TABLET | Freq: Every day | ORAL | 0 refills | Status: DC
Start: 2023-06-25 — End: 2023-09-03

## 2023-06-25 MED ORDER — METFORMIN HCL ER 750 MG PO TB24
750.0000 mg | ORAL_TABLET | Freq: Two times a day (BID) | ORAL | 0 refills | Status: DC
Start: 2023-06-25 — End: 2023-10-14
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
## 2023-07-19 ENCOUNTER — Other Ambulatory Visit: Payer: Self-pay | Admitting: Internal Medicine

## 2023-07-19 DIAGNOSIS — I10 Essential (primary) hypertension: Secondary | ICD-10-CM

## 2023-08-03 DIAGNOSIS — E119 Type 2 diabetes mellitus without complications: Secondary | ICD-10-CM | POA: Diagnosis not present

## 2023-08-03 DIAGNOSIS — M25511 Pain in right shoulder: Secondary | ICD-10-CM | POA: Diagnosis not present

## 2023-08-06 ENCOUNTER — Other Ambulatory Visit: Payer: Self-pay | Admitting: Internal Medicine

## 2023-08-24 DIAGNOSIS — M25511 Pain in right shoulder: Secondary | ICD-10-CM | POA: Diagnosis not present

## 2023-08-24 DIAGNOSIS — M25611 Stiffness of right shoulder, not elsewhere classified: Secondary | ICD-10-CM | POA: Diagnosis not present

## 2023-08-28 ENCOUNTER — Other Ambulatory Visit: Payer: Self-pay | Admitting: Internal Medicine

## 2023-09-01 ENCOUNTER — Other Ambulatory Visit: Payer: Self-pay | Admitting: Internal Medicine

## 2023-09-01 DIAGNOSIS — I10 Essential (primary) hypertension: Secondary | ICD-10-CM

## 2023-09-01 MED ORDER — HYDRALAZINE HCL 100 MG PO TABS
100.0000 mg | ORAL_TABLET | Freq: Three times a day (TID) | ORAL | 0 refills | Status: DC
Start: 2023-09-01 — End: 2023-10-07

## 2023-09-02 ENCOUNTER — Other Ambulatory Visit: Payer: Self-pay | Admitting: Internal Medicine

## 2023-09-02 DIAGNOSIS — I10 Essential (primary) hypertension: Secondary | ICD-10-CM

## 2023-09-03 ENCOUNTER — Other Ambulatory Visit: Payer: Self-pay

## 2023-09-24 ENCOUNTER — Ambulatory Visit: Payer: Medicare HMO | Admitting: Internal Medicine

## 2023-10-05 ENCOUNTER — Other Ambulatory Visit: Payer: Self-pay | Admitting: Internal Medicine

## 2023-10-05 ENCOUNTER — Ambulatory Visit: Payer: Medicare HMO | Admitting: Internal Medicine

## 2023-10-05 DIAGNOSIS — I10 Essential (primary) hypertension: Secondary | ICD-10-CM

## 2023-10-09 ENCOUNTER — Other Ambulatory Visit: Payer: Self-pay | Admitting: Internal Medicine

## 2023-10-09 DIAGNOSIS — S43402A Unspecified sprain of left shoulder joint, initial encounter: Secondary | ICD-10-CM

## 2023-10-09 DIAGNOSIS — E119 Type 2 diabetes mellitus without complications: Secondary | ICD-10-CM

## 2023-10-11 ENCOUNTER — Ambulatory Visit: Payer: Medicare HMO | Admitting: Internal Medicine

## 2023-11-01 ENCOUNTER — Other Ambulatory Visit: Payer: No Typology Code available for payment source

## 2023-11-02 LAB — LIPID PANEL
Chol/HDL Ratio: 3.2 {ratio} (ref 0.0–5.0)
Cholesterol, Total: 164 mg/dL (ref 100–199)
HDL: 51 mg/dL (ref >39)
LDL Chol Calc (NIH): 98 mg/dL (ref 0–99)
Triglycerides: 77 mg/dL (ref 0–149)
VLDL Cholesterol Cal: 15 mg/dL (ref 5–40)

## 2023-11-02 LAB — HEMOGLOBIN A1C
Est. average glucose Bld gHb Est-mCnc: 100 mg/dL
Hgb A1c MFr Bld: 5.1 % (ref 4.8–5.6)

## 2023-11-03 ENCOUNTER — Encounter: Payer: Self-pay | Admitting: Internal Medicine

## 2023-11-03 ENCOUNTER — Ambulatory Visit (INDEPENDENT_AMBULATORY_CARE_PROVIDER_SITE_OTHER): Payer: Medicare HMO | Admitting: Internal Medicine

## 2023-11-03 ENCOUNTER — Other Ambulatory Visit: Payer: Self-pay

## 2023-11-03 VITALS — BP 128/80 | HR 75 | Temp 97.5°F | Ht 65.0 in | Wt 193.4 lb

## 2023-11-03 DIAGNOSIS — I10 Essential (primary) hypertension: Secondary | ICD-10-CM

## 2023-11-03 DIAGNOSIS — E119 Type 2 diabetes mellitus without complications: Secondary | ICD-10-CM

## 2023-11-03 DIAGNOSIS — E782 Mixed hyperlipidemia: Secondary | ICD-10-CM | POA: Diagnosis not present

## 2023-11-03 DIAGNOSIS — N400 Enlarged prostate without lower urinary tract symptoms: Secondary | ICD-10-CM | POA: Diagnosis not present

## 2023-11-03 LAB — GLUCOSE, POCT (MANUAL RESULT ENTRY): POC Glucose: 98 mg/dL (ref 70–99)

## 2023-11-03 MED ORDER — AMLODIPINE BESYLATE 10 MG PO TABS
10.0000 mg | ORAL_TABLET | Freq: Every day | ORAL | 1 refills | Status: DC
Start: 2023-11-03 — End: 2024-04-12

## 2023-11-03 MED ORDER — OLMESARTAN MEDOXOMIL 40 MG PO TABS
40.0000 mg | ORAL_TABLET | Freq: Every day | ORAL | 0 refills | Status: DC
Start: 2023-11-03 — End: 2024-02-01

## 2023-11-03 MED ORDER — ATORVASTATIN CALCIUM 40 MG PO TABS
40.0000 mg | ORAL_TABLET | Freq: Every evening | ORAL | 1 refills | Status: DC
Start: 2023-11-03 — End: 2024-04-12

## 2023-11-03 MED ORDER — GLUCOSE BLOOD VI STRP
ORAL_STRIP | 3 refills | Status: AC
Start: 2023-11-03 — End: ?

## 2023-11-03 MED ORDER — HYDRALAZINE HCL 100 MG PO TABS
100.0000 mg | ORAL_TABLET | Freq: Three times a day (TID) | ORAL | 0 refills | Status: DC
Start: 2023-11-03 — End: 2023-11-17
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
## 2023-11-13 ENCOUNTER — Other Ambulatory Visit: Payer: Self-pay | Admitting: Internal Medicine

## 2023-11-13 DIAGNOSIS — I10 Essential (primary) hypertension: Secondary | ICD-10-CM

## 2023-11-17 ENCOUNTER — Other Ambulatory Visit: Payer: Self-pay | Admitting: Internal Medicine

## 2023-11-17 DIAGNOSIS — I10 Essential (primary) hypertension: Secondary | ICD-10-CM

## 2023-11-17 MED ORDER — HYDRALAZINE HCL 100 MG PO TABS
100.0000 mg | ORAL_TABLET | Freq: Three times a day (TID) | ORAL | 0 refills | Status: DC
Start: 2023-11-17 — End: 2024-02-01

## 2023-12-06 ENCOUNTER — Other Ambulatory Visit: Payer: Self-pay

## 2023-12-06 MED ORDER — ALBUTEROL SULFATE HFA 108 (90 BASE) MCG/ACT IN AERS: 2.0000 | INHALATION_SPRAY | Freq: Four times a day (QID) | RESPIRATORY_TRACT | 1 refills | Status: DC | PRN

## 2024-01-26 ENCOUNTER — Other Ambulatory Visit: Payer: Self-pay | Admitting: Internal Medicine

## 2024-01-31 ENCOUNTER — Other Ambulatory Visit

## 2024-01-31 DIAGNOSIS — N400 Enlarged prostate without lower urinary tract symptoms: Secondary | ICD-10-CM

## 2024-01-31 DIAGNOSIS — E782 Mixed hyperlipidemia: Secondary | ICD-10-CM

## 2024-01-31 DIAGNOSIS — I10 Essential (primary) hypertension: Secondary | ICD-10-CM

## 2024-01-31 DIAGNOSIS — E119 Type 2 diabetes mellitus without complications: Secondary | ICD-10-CM

## 2024-02-01 ENCOUNTER — Encounter: Payer: Self-pay | Admitting: Internal Medicine

## 2024-02-01 ENCOUNTER — Ambulatory Visit (INDEPENDENT_AMBULATORY_CARE_PROVIDER_SITE_OTHER): Payer: No Typology Code available for payment source | Admitting: Internal Medicine

## 2024-02-01 VITALS — BP 131/72 | HR 65 | Temp 98.0°F | Ht 65.0 in | Wt 181.0 lb

## 2024-02-01 DIAGNOSIS — I10 Essential (primary) hypertension: Secondary | ICD-10-CM | POA: Diagnosis not present

## 2024-02-01 DIAGNOSIS — Z1211 Encounter for screening for malignant neoplasm of colon: Secondary | ICD-10-CM

## 2024-02-01 DIAGNOSIS — E119 Type 2 diabetes mellitus without complications: Secondary | ICD-10-CM

## 2024-02-01 DIAGNOSIS — Z0001 Encounter for general adult medical examination with abnormal findings: Secondary | ICD-10-CM

## 2024-02-01 DIAGNOSIS — S43402A Unspecified sprain of left shoulder joint, initial encounter: Secondary | ICD-10-CM | POA: Diagnosis not present

## 2024-02-01 LAB — CBC WITH DIFF/PLATELET
Basophils Absolute: 0 10*3/uL (ref 0.0–0.2)
Basos: 1 %
EOS (ABSOLUTE): 0.1 10*3/uL (ref 0.0–0.4)
Eos: 2 %
Hematocrit: 41.6 % (ref 37.5–51.0)
Hemoglobin: 13.6 g/dL (ref 13.0–17.7)
Immature Grans (Abs): 0 10*3/uL (ref 0.0–0.1)
Immature Granulocytes: 0 %
Lymphocytes Absolute: 2.2 10*3/uL (ref 0.7–3.1)
Lymphs: 47 %
MCH: 30 pg (ref 26.6–33.0)
MCHC: 32.7 g/dL (ref 31.5–35.7)
MCV: 92 fL (ref 79–97)
Monocytes Absolute: 0.4 10*3/uL (ref 0.1–0.9)
Monocytes: 7 %
Neutrophils Absolute: 2 10*3/uL (ref 1.4–7.0)
Neutrophils: 43 %
Platelets: 192 10*3/uL (ref 150–450)
RBC: 4.54 x10E6/uL (ref 4.14–5.80)
RDW: 12.6 % (ref 11.6–15.4)
WBC: 4.7 10*3/uL (ref 3.4–10.8)

## 2024-02-01 LAB — COMPREHENSIVE METABOLIC PANEL WITH GFR
ALT: 11 IU/L (ref 0–44)
AST: 16 IU/L (ref 0–40)
Albumin: 4.4 g/dL (ref 3.9–4.9)
Alkaline Phosphatase: 48 IU/L (ref 44–121)
BUN/Creatinine Ratio: 18 (ref 10–24)
BUN: 17 mg/dL (ref 8–27)
Bilirubin Total: 0.8 mg/dL (ref 0.0–1.2)
CO2: 24 mmol/L (ref 20–29)
Calcium: 10.4 mg/dL — ABNORMAL HIGH (ref 8.6–10.2)
Chloride: 103 mmol/L (ref 96–106)
Creatinine, Ser: 0.95 mg/dL (ref 0.76–1.27)
Globulin, Total: 2.1 g/dL (ref 1.5–4.5)
Glucose: 87 mg/dL (ref 70–99)
Potassium: 3.5 mmol/L (ref 3.5–5.2)
Sodium: 141 mmol/L (ref 134–144)
Total Protein: 6.5 g/dL (ref 6.0–8.5)
eGFR: 90 mL/min/{1.73_m2} (ref >59)

## 2024-02-01 LAB — HEMOGLOBIN A1C
Est. average glucose Bld gHb Est-mCnc: 91 mg/dL
Hgb A1c MFr Bld: 4.8 % (ref 4.8–5.6)

## 2024-02-01 LAB — TSH: TSH: 0.54 u[IU]/mL (ref 0.450–4.500)

## 2024-02-01 LAB — LIPID PANEL
Chol/HDL Ratio: 2.8 ratio (ref 0.0–5.0)
Cholesterol, Total: 150 mg/dL (ref 100–199)
HDL: 54 mg/dL (ref >39)
LDL Chol Calc (NIH): 78 mg/dL (ref 0–99)
Triglycerides: 97 mg/dL (ref 0–149)
VLDL Cholesterol Cal: 18 mg/dL (ref 5–40)

## 2024-02-01 LAB — PSA: Prostate Specific Ag, Serum: 5.7 ng/mL — ABNORMAL HIGH (ref 0.0–4.0)

## 2024-02-01 LAB — CK: Total CK: 98 U/L (ref 41–331)

## 2024-02-01 LAB — POCT CBG (FASTING - GLUCOSE)-MANUAL ENTRY: Glucose Fasting, POC: 109 mg/dL — AB (ref 70–99)

## 2024-02-01 MED ORDER — METFORMIN HCL ER 750 MG PO TB24: 750.0000 mg | ORAL_TABLET | Freq: Two times a day (BID) | ORAL | 0 refills | Status: DC

## 2024-02-01 MED ORDER — HYDRALAZINE HCL 100 MG PO TABS: 100.0000 mg | ORAL_TABLET | Freq: Three times a day (TID) | ORAL | 0 refills | Status: DC

## 2024-02-01 MED ORDER — OLMESARTAN MEDOXOMIL 40 MG PO TABS: 40.0000 mg | ORAL_TABLET | Freq: Every day | ORAL | 0 refills | Status: DC
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
## 2024-02-03 ENCOUNTER — Other Ambulatory Visit: Payer: Self-pay | Admitting: Internal Medicine

## 2024-02-03 DIAGNOSIS — I10 Essential (primary) hypertension: Secondary | ICD-10-CM

## 2024-04-07 ENCOUNTER — Telehealth: Payer: Self-pay

## 2024-04-07 NOTE — Telephone Encounter (Signed)
 [facility] sent a fax asking for his prescriptons to be sent to them with a  consent date of 03/31/24, we will need to confirm this with the patient before sending refills to this company since in the past they have falsly gotten consents and the patient in fact did not want prescriptions sent there.

## 2024-04-11 ENCOUNTER — Other Ambulatory Visit: Payer: Self-pay | Admitting: Internal Medicine

## 2024-04-11 DIAGNOSIS — E782 Mixed hyperlipidemia: Secondary | ICD-10-CM

## 2024-04-11 DIAGNOSIS — I10 Essential (primary) hypertension: Secondary | ICD-10-CM

## 2024-04-11 DIAGNOSIS — E119 Type 2 diabetes mellitus without complications: Secondary | ICD-10-CM

## 2024-04-12 ENCOUNTER — Other Ambulatory Visit: Payer: Self-pay | Admitting: Internal Medicine

## 2024-04-12 ENCOUNTER — Other Ambulatory Visit: Payer: Self-pay

## 2024-04-12 DIAGNOSIS — E119 Type 2 diabetes mellitus without complications: Secondary | ICD-10-CM

## 2024-04-12 DIAGNOSIS — E782 Mixed hyperlipidemia: Secondary | ICD-10-CM

## 2024-04-12 DIAGNOSIS — I10 Essential (primary) hypertension: Secondary | ICD-10-CM

## 2024-04-12 MED ORDER — ATORVASTATIN CALCIUM 40 MG PO TABS
40.0000 mg | ORAL_TABLET | Freq: Every day | ORAL | 1 refills | Status: DC
Start: 2024-04-12 — End: 2024-09-01

## 2024-04-12 MED ORDER — HYDRALAZINE HCL 100 MG PO TABS
100.0000 mg | ORAL_TABLET | Freq: Three times a day (TID) | ORAL | 0 refills | Status: DC
Start: 2024-04-12 — End: 2024-06-09

## 2024-04-12 MED ORDER — ALBUTEROL SULFATE HFA 108 (90 BASE) MCG/ACT IN AERS: 2.0000 | INHALATION_SPRAY | Freq: Four times a day (QID) | RESPIRATORY_TRACT | 1 refills | Status: DC | PRN

## 2024-04-12 MED ORDER — HYDROCHLOROTHIAZIDE 25 MG PO TABS: 25.0000 mg | ORAL_TABLET | Freq: Every day | ORAL | 1 refills | Status: DC

## 2024-04-12 MED ORDER — AMLODIPINE BESYLATE 10 MG PO TABS: 10.0000 mg | ORAL_TABLET | Freq: Every day | ORAL | 1 refills | Status: DC

## 2024-04-19 ENCOUNTER — Telehealth: Payer: Self-pay

## 2024-04-19 NOTE — Telephone Encounter (Signed)
 [facility] pharmacy called to see if we received the patient reported fax request for prescriptions and is requesting a call back to let them know if received.

## 2024-04-20 NOTE — Telephone Encounter (Signed)
 Called patient but phone cuts off. No VM

## 2024-05-02 ENCOUNTER — Other Ambulatory Visit

## 2024-05-02 DIAGNOSIS — E119 Type 2 diabetes mellitus without complications: Secondary | ICD-10-CM

## 2024-05-03 ENCOUNTER — Ambulatory Visit: Payer: Self-pay | Admitting: Internal Medicine

## 2024-05-03 ENCOUNTER — Ambulatory Visit: Admitting: Internal Medicine

## 2024-05-03 ENCOUNTER — Ambulatory Visit (INDEPENDENT_AMBULATORY_CARE_PROVIDER_SITE_OTHER): Admitting: Internal Medicine

## 2024-05-03 ENCOUNTER — Encounter: Payer: Self-pay | Admitting: Internal Medicine

## 2024-05-03 VITALS — BP 118/88 | HR 62 | Temp 97.9°F | Ht 65.0 in | Wt 164.0 lb

## 2024-05-03 DIAGNOSIS — R972 Elevated prostate specific antigen [PSA]: Secondary | ICD-10-CM | POA: Diagnosis not present

## 2024-05-03 DIAGNOSIS — Z0130 Encounter for examination of blood pressure without abnormal findings: Secondary | ICD-10-CM

## 2024-05-03 DIAGNOSIS — E782 Mixed hyperlipidemia: Secondary | ICD-10-CM | POA: Diagnosis not present

## 2024-05-03 DIAGNOSIS — Z1211 Encounter for screening for malignant neoplasm of colon: Secondary | ICD-10-CM

## 2024-05-03 DIAGNOSIS — R634 Abnormal weight loss: Secondary | ICD-10-CM | POA: Insufficient documentation

## 2024-05-03 DIAGNOSIS — E119 Type 2 diabetes mellitus without complications: Secondary | ICD-10-CM

## 2024-05-03 LAB — LIPID PANEL
Chol/HDL Ratio: 2.2 ratio (ref 0.0–5.0)
Cholesterol, Total: 145 mg/dL (ref 100–199)
HDL: 66 mg/dL (ref >39)
LDL Chol Calc (NIH): 67 mg/dL (ref 0–99)
Triglycerides: 54 mg/dL (ref 0–149)
VLDL Cholesterol Cal: 12 mg/dL (ref 5–40)

## 2024-05-03 LAB — POCT CBG (FASTING - GLUCOSE)-MANUAL ENTRY: Glucose Fasting, POC: 106 mg/dL — AB (ref 70–99)
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
## 2024-05-09 ENCOUNTER — Other Ambulatory Visit: Payer: Self-pay

## 2024-05-09 DIAGNOSIS — N400 Enlarged prostate without lower urinary tract symptoms: Secondary | ICD-10-CM

## 2024-05-09 DIAGNOSIS — R634 Abnormal weight loss: Secondary | ICD-10-CM

## 2024-06-02 ENCOUNTER — Other Ambulatory Visit

## 2024-06-02 DIAGNOSIS — E782 Mixed hyperlipidemia: Secondary | ICD-10-CM | POA: Diagnosis not present

## 2024-06-02 DIAGNOSIS — E119 Type 2 diabetes mellitus without complications: Secondary | ICD-10-CM | POA: Diagnosis not present

## 2024-06-02 DIAGNOSIS — R634 Abnormal weight loss: Secondary | ICD-10-CM | POA: Diagnosis not present

## 2024-06-02 LAB — HEMOGLOBIN A1C
Est. average glucose Bld gHb Est-mCnc: 91 mg/dL
Hgb A1c MFr Bld: 4.8 % (ref 4.8–5.6)

## 2024-06-03 LAB — COMPREHENSIVE METABOLIC PANEL WITH GFR
ALT: 14 IU/L (ref 0–44)
AST: 20 IU/L (ref 0–40)
Albumin: 4.4 g/dL (ref 3.9–4.9)
Alkaline Phosphatase: 53 IU/L (ref 44–121)
BUN/Creatinine Ratio: 15 (ref 10–24)
BUN: 13 mg/dL (ref 8–27)
Bilirubin Total: 0.5 mg/dL (ref 0.0–1.2)
CO2: 24 mmol/L (ref 20–29)
Calcium: 10.2 mg/dL (ref 8.6–10.2)
Chloride: 102 mmol/L (ref 96–106)
Creatinine, Ser: 0.86 mg/dL (ref 0.76–1.27)
Globulin, Total: 2.1 g/dL (ref 1.5–4.5)
Glucose: 84 mg/dL (ref 70–99)
Potassium: 4.7 mmol/L (ref 3.5–5.2)
Sodium: 140 mmol/L (ref 134–144)
Total Protein: 6.5 g/dL (ref 6.0–8.5)
eGFR: 97 mL/min/1.73 (ref >59)

## 2024-06-03 LAB — LIPID PANEL
Chol/HDL Ratio: 2.2 ratio (ref 0.0–5.0)
Cholesterol, Total: 164 mg/dL (ref 100–199)
HDL: 73 mg/dL (ref >39)
LDL Chol Calc (NIH): 81 mg/dL (ref 0–99)
Triglycerides: 49 mg/dL (ref 0–149)
VLDL Cholesterol Cal: 10 mg/dL (ref 5–40)

## 2024-06-03 LAB — PSA: Prostate Specific Ag, Serum: 5.3 ng/mL — ABNORMAL HIGH (ref 0.0–4.0)

## 2024-06-05 ENCOUNTER — Encounter: Payer: Self-pay | Admitting: Urology

## 2024-06-05 ENCOUNTER — Ambulatory Visit (INDEPENDENT_AMBULATORY_CARE_PROVIDER_SITE_OTHER): Admitting: Urology

## 2024-06-05 VITALS — BP 131/77 | HR 92 | Ht 65.0 in | Wt 163.0 lb

## 2024-06-05 DIAGNOSIS — R972 Elevated prostate specific antigen [PSA]: Secondary | ICD-10-CM
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
## 2024-06-05 NOTE — Patient Instructions (Signed)
 Please contact Central Scheduling to set up your prostate MRI at [phone].  Prostate MRI Prep:  1- No ejaculation 48 hours prior to exam  2- No caffeine or carbonated beverages on day of the exam  3- Eat light diet evening prior and day of exam  4- Avoid eating 4 hours prior to exam  5- Fleets enema needs to be done 4 hours prior to exam -See below. Can be purchased at the drug store.

## 2024-06-07 ENCOUNTER — Other Ambulatory Visit: Payer: Self-pay | Admitting: Internal Medicine

## 2024-06-07 DIAGNOSIS — I10 Essential (primary) hypertension: Secondary | ICD-10-CM

## 2024-06-29 ENCOUNTER — Ambulatory Visit: Admission: RE | Admit: 2024-06-29 | Source: Ambulatory Visit

## 2024-07-01 ENCOUNTER — Telehealth: Payer: Self-pay | Admitting: Urology

## 2024-07-01 NOTE — Telephone Encounter (Signed)
 Patient mother canceled or no-showed for his prostate MRI.  Please check and see if he is going to reschedule

## 2024-07-03 NOTE — Telephone Encounter (Signed)
 Talked with patient and he states he is having some family issue and he will rescheduled appt for next month . He states he has the number to scheduling

## 2024-08-04 ENCOUNTER — Other Ambulatory Visit: Payer: Self-pay | Admitting: Internal Medicine

## 2024-08-04 ENCOUNTER — Ambulatory Visit: Admitting: Internal Medicine

## 2024-08-20 ENCOUNTER — Telehealth: Payer: Self-pay | Admitting: Urology

## 2024-08-20 NOTE — Telephone Encounter (Signed)
 Patient indicated he was going to schedule his prostate MRI last month however it has not been scheduled.  Please contact regarding scheduling

## 2024-08-21 NOTE — Telephone Encounter (Signed)
 Voice mail is full could not leave message will call back

## 2024-08-23 NOTE — Telephone Encounter (Signed)
 Called back today no voice mail .

## 2024-08-30 ENCOUNTER — Other Ambulatory Visit: Payer: Self-pay | Admitting: Internal Medicine

## 2024-08-30 DIAGNOSIS — E119 Type 2 diabetes mellitus without complications: Secondary | ICD-10-CM

## 2024-08-30 DIAGNOSIS — E782 Mixed hyperlipidemia: Secondary | ICD-10-CM

## 2024-08-30 DIAGNOSIS — I10 Essential (primary) hypertension: Secondary | ICD-10-CM

## 2024-08-31 NOTE — Telephone Encounter (Signed)
 Can't leave voice mail . Trued to call angain . Please send letter

## 2024-09-04 ENCOUNTER — Encounter: Payer: Self-pay | Admitting: Urology

## 2024-09-12 ENCOUNTER — Ambulatory Visit: Admitting: Internal Medicine

## 2024-09-18 ENCOUNTER — Ambulatory Visit: Admitting: Internal Medicine

## 2024-09-19 ENCOUNTER — Ambulatory Visit: Admitting: Internal Medicine

## 2024-09-28 ENCOUNTER — Other Ambulatory Visit

## 2024-09-28 DIAGNOSIS — E782 Mixed hyperlipidemia: Secondary | ICD-10-CM

## 2024-09-28 DIAGNOSIS — E119 Type 2 diabetes mellitus without complications: Secondary | ICD-10-CM

## 2024-09-29 ENCOUNTER — Ambulatory Visit: Admitting: Internal Medicine

## 2024-09-29 ENCOUNTER — Ambulatory Visit: Payer: Self-pay | Admitting: Internal Medicine

## 2024-09-29 DIAGNOSIS — I10 Essential (primary) hypertension: Secondary | ICD-10-CM

## 2024-09-29 DIAGNOSIS — E1165 Type 2 diabetes mellitus with hyperglycemia: Secondary | ICD-10-CM | POA: Diagnosis not present

## 2024-09-29 DIAGNOSIS — E782 Mixed hyperlipidemia: Secondary | ICD-10-CM

## 2024-09-29 DIAGNOSIS — E119 Type 2 diabetes mellitus without complications: Secondary | ICD-10-CM

## 2024-09-29 LAB — LIPID PANEL
Chol/HDL Ratio: 2.1 ratio (ref 0.0–5.0)
Cholesterol, Total: 143 mg/dL (ref 100–199)
HDL: 68 mg/dL (ref >39)
LDL Chol Calc (NIH): 65 mg/dL (ref 0–99)
Triglycerides: 45 mg/dL (ref 0–149)
VLDL Cholesterol Cal: 10 mg/dL (ref 5–40)

## 2024-09-29 LAB — HEMOGLOBIN A1C
Est. average glucose Bld gHb Est-mCnc: 91 mg/dL
Hgb A1c MFr Bld: 4.8 % (ref 4.8–5.6)

## 2024-09-29 LAB — POCT UA - MICROALBUMIN
Albumin/Creatinine Ratio, Urine, POC: <30
Creatinine, POC: 50 mg/dL
Microalbumin Ur, POC: 10 mg/L

## 2024-09-29 LAB — POCT CBG (FASTING - GLUCOSE)-MANUAL ENTRY: Glucose Fasting, POC: 111 mg/dL — AB (ref 70–99)

## 2024-09-29 LAB — CK: Total CK: 174 U/L (ref 41–331)

## 2024-09-29 MED ORDER — METFORMIN HCL ER 750 MG PO TB24: 750.0000 mg | ORAL_TABLET | Freq: Every day | ORAL | 0 refills | Status: AC

## 2024-09-29 MED ORDER — ALBUTEROL SULFATE HFA 108 (90 BASE) MCG/ACT IN AERS: 2.0000 | INHALATION_SPRAY | Freq: Four times a day (QID) | RESPIRATORY_TRACT | 2 refills | Status: AC | PRN

## 2024-09-29 MED ORDER — OLMESARTAN MEDOXOMIL 40 MG PO TABS: 40.0000 mg | ORAL_TABLET | Freq: Every day | ORAL | 0 refills | Status: AC

## 2024-09-29 MED ORDER — AMLODIPINE BESYLATE 10 MG PO TABS: 10.0000 mg | ORAL_TABLET | Freq: Every day | ORAL | 0 refills | Status: AC

## 2024-09-29 MED ORDER — HYDROCHLOROTHIAZIDE 25 MG PO TABS: 25.0000 mg | ORAL_TABLET | Freq: Every day | ORAL | 0 refills | Status: AC

## 2024-09-29 MED ORDER — ATORVASTATIN CALCIUM 40 MG PO TABS: 40.0000 mg | ORAL_TABLET | Freq: Every evening | ORAL | 0 refills | Status: AC
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
## 2024-12-29 ENCOUNTER — Ambulatory Visit: Admitting: Internal Medicine
